# Patient Record
Sex: Female | Born: 1960 | Race: Black or African American | Hispanic: No | Smoking: Never smoker
Health system: Southern US, Community
[De-identification: ages and names within clinical notes are randomized; demographics above are authoritative.]

## PROBLEM LIST (undated history)

## (undated) ENCOUNTER — Encounter

## (undated) ENCOUNTER — Other Ambulatory Visit

## (undated) ENCOUNTER — Telehealth

## (undated) DIAGNOSIS — N760 Acute vaginitis: Secondary | ICD-10-CM

## (undated) DIAGNOSIS — B9689 Other specified bacterial agents as the cause of diseases classified elsewhere: Secondary | ICD-10-CM

## (undated) DIAGNOSIS — I10 Essential (primary) hypertension: Secondary | ICD-10-CM

## (undated) DIAGNOSIS — E785 Hyperlipidemia, unspecified: Secondary | ICD-10-CM

## (undated) DIAGNOSIS — J45909 Unspecified asthma, uncomplicated: Secondary | ICD-10-CM

## (undated) DIAGNOSIS — A539 Syphilis, unspecified: Secondary | ICD-10-CM

## (undated) DIAGNOSIS — B20 Human immunodeficiency virus [HIV] disease: Secondary | ICD-10-CM

## (undated) DIAGNOSIS — Z21 Asymptomatic human immunodeficiency virus [HIV] infection status: Secondary | ICD-10-CM

## (undated) HISTORY — DX: Essential (primary) hypertension: I10

## (undated) HISTORY — PX: MANDIBLE SURGERY: SHX707

## (undated) HISTORY — PX: ABDOMINAL HYSTERECTOMY: SHX81

## (undated) HISTORY — PX: CHOLECYSTECTOMY: SHX55

## (undated) HISTORY — PX: REVISION TOTAL KNEE ARTHROPLASTY: SHX767

## (undated) HISTORY — DX: Hyperlipidemia, unspecified: E78.5

---

## 2016-05-07 ENCOUNTER — Encounter: Primary: Family Medicine

## 2016-05-20 DIAGNOSIS — Z1231 Encounter for screening mammogram for malignant neoplasm of breast: Principal | ICD-10-CM

## 2016-05-23 ENCOUNTER — Inpatient Hospital Stay: Admit: 2016-05-23 | Discharge: 2016-05-24 | Primary: Family Medicine

## 2016-05-23 DIAGNOSIS — J45909 Unspecified asthma, uncomplicated: Secondary | ICD-10-CM

## 2016-05-23 DIAGNOSIS — F319 Bipolar disorder, unspecified: Secondary | ICD-10-CM

## 2016-05-23 DIAGNOSIS — Z79890 Hormone replacement therapy (postmenopausal): Secondary | ICD-10-CM

## 2016-05-23 DIAGNOSIS — F329 Major depressive disorder, single episode, unspecified: Secondary | ICD-10-CM

## 2016-05-23 DIAGNOSIS — R921 Mammographic calcification found on diagnostic imaging of breast: Secondary | ICD-10-CM

## 2016-05-23 DIAGNOSIS — B999 Unspecified infectious disease: Secondary | ICD-10-CM

## 2016-05-23 DIAGNOSIS — B20 Human immunodeficiency virus [HIV] disease: Principal | ICD-10-CM

## 2016-05-23 DIAGNOSIS — M5126 Other intervertebral disc displacement, lumbar region: Secondary | ICD-10-CM

## 2016-05-23 DIAGNOSIS — I10 Essential (primary) hypertension: Secondary | ICD-10-CM

## 2016-05-23 DIAGNOSIS — M1990 Unspecified osteoarthritis, unspecified site: Secondary | ICD-10-CM

## 2016-05-23 DIAGNOSIS — D849 Immunodeficiency, unspecified: Secondary | ICD-10-CM

## 2016-05-23 DIAGNOSIS — Z1231 Encounter for screening mammogram for malignant neoplasm of breast: Principal | ICD-10-CM

## 2016-05-23 DIAGNOSIS — N8111 Cystocele, midline: Secondary | ICD-10-CM

## 2016-06-09 ENCOUNTER — Ambulatory Visit: Admit: 2016-06-09 | Discharge: 2016-06-09 | Attending: Family | Primary: Family Medicine

## 2016-06-09 DIAGNOSIS — Z79890 Hormone replacement therapy (postmenopausal): Secondary | ICD-10-CM

## 2016-06-09 DIAGNOSIS — Z780 Asymptomatic menopausal state: Secondary | ICD-10-CM

## 2016-06-09 DIAGNOSIS — D849 Immunodeficiency, unspecified: Secondary | ICD-10-CM

## 2016-06-09 DIAGNOSIS — F329 Major depressive disorder, single episode, unspecified: Secondary | ICD-10-CM

## 2016-06-09 DIAGNOSIS — Z21 Asymptomatic human immunodeficiency virus [HIV] infection status: Secondary | ICD-10-CM

## 2016-06-09 DIAGNOSIS — M542 Cervicalgia: Secondary | ICD-10-CM

## 2016-06-09 DIAGNOSIS — B20 Human immunodeficiency virus [HIV] disease: Principal | ICD-10-CM

## 2016-06-09 DIAGNOSIS — M5126 Other intervertebral disc displacement, lumbar region: Secondary | ICD-10-CM

## 2016-06-09 DIAGNOSIS — J45909 Unspecified asthma, uncomplicated: Secondary | ICD-10-CM

## 2016-06-09 DIAGNOSIS — N3946 Mixed incontinence: Secondary | ICD-10-CM

## 2016-06-09 DIAGNOSIS — Z9889 Other specified postprocedural states: Secondary | ICD-10-CM

## 2016-06-09 DIAGNOSIS — N952 Postmenopausal atrophic vaginitis: Secondary | ICD-10-CM

## 2016-06-09 DIAGNOSIS — B9689 Other specified bacterial agents as the cause of diseases classified elsewhere: Secondary | ICD-10-CM

## 2016-06-09 DIAGNOSIS — N8111 Cystocele, midline: Secondary | ICD-10-CM

## 2016-06-09 DIAGNOSIS — N760 Acute vaginitis: Secondary | ICD-10-CM

## 2016-06-09 DIAGNOSIS — F319 Bipolar disorder, unspecified: Secondary | ICD-10-CM

## 2016-06-09 DIAGNOSIS — I10 Essential (primary) hypertension: Secondary | ICD-10-CM

## 2016-06-09 DIAGNOSIS — B999 Unspecified infectious disease: Secondary | ICD-10-CM

## 2016-06-09 DIAGNOSIS — Z79899 Other long term (current) drug therapy: Secondary | ICD-10-CM

## 2016-06-09 DIAGNOSIS — N3289 Other specified disorders of bladder: Principal | ICD-10-CM

## 2016-06-09 DIAGNOSIS — N941 Unspecified dyspareunia: Secondary | ICD-10-CM

## 2016-06-09 DIAGNOSIS — M1990 Unspecified osteoarthritis, unspecified site: Secondary | ICD-10-CM

## 2016-06-09 DIAGNOSIS — R310 Gross hematuria: Secondary | ICD-10-CM

## 2016-06-09 MED ORDER — ESTROGENS, CONJUGATED 0.625 MG/GM VA CREA
4 refills | Status: CP
Start: 2016-06-09 — End: 2016-06-10

## 2016-06-09 MED ORDER — MELOXICAM 7.5 MG PO TABS
7.5 mg | Freq: Every day | ORAL
Start: 2016-06-09 — End: ?

## 2016-06-09 MED ORDER — GENVOYA PO
ORAL
Start: 2016-06-09 — End: ?

## 2016-06-09 MED ORDER — ESTROGENS, CONJUGATED 0.625 MG/GM VA CREA
4 refills | Status: CP
Start: 2016-06-09 — End: ?

## 2016-06-10 NOTE — Progress Notes
Female Pelvic Medicine & Reconstructive Surgery (Urogynecology) Clinic    Pertinent Urogyn Hx:  4 Bladder Surgeries (prolapse repair, mesh insertion, mesh removal, and Sling ([place] and 1 here at [facility] 01/2012 AR &TOT  [facility] [place] [facility] (2 different Physicians)  HIV Positive since 1990  S/P Hyst for AUB/Fibroids  MUI (S>U)    Primary care Doctor: Prentiss BellsJaminal, Mar A    REASON FOR VISIT: Bladder Prolapse per referral  Chief Complaint   Patient presents with   ? Uro/Gyn Initial Visit     HISTORY OF PRESENT ILLNESS:   The patient is a 55 y.o. G3P2 postmenopausal female, who is being seen today for bladder prolapse. She reports that she leaks on occasion with stress maneuvers. She endorses leaking on the way to the bathroom. She states that she has signed a form for a class action law suit for problems after mesh placement. She also reports a chronic problem with BV and vagianal discharge. She states that she has pain and bleeding with intercourse and saw blood in the last 2 months after voiding when she wiped. She states that she is most bothered by the SUI.    UROGYN Hx:  Pelvic Organ Prolapse:  ? Patient reports bulge symptoms including: she feels something at the opening when she wipes     Urinary Incontinence:  ? Daytime voids: 6 (she feels like this is too often)  ? Nighttime Voids:  3 to 4    ? Patient routinely experiences strong urinary urge that is difficult to ignore.  ? Patient has urge of urination Yes  ? Bladder irritants consumed: Yes; List: coffee on occas, occas soda, sugars and candy  ? Patient has taken Oxybutynin in the past with some help - prior to her surgeries  ? Urge Urinary Incontince: sudden and immediate urge to void.  ? The patient has leaked urine with urgency for 1 year   ? Number of UUI episodes in daytime: 4 - 6  Number of UUI episodes in nighttime: 3 - 4     ? Patient with stress urinary Incontinence: Yes, If yes SUI occurs with:

## 2016-06-10 NOTE — Progress Notes
3. Mixed stress and urge urinary incontinence N39.46 788.33    4. Dyspareunia, female N94.10 625.0    5. History of genitourinary surgery Z98.890 V45.89 CT UROGRAM w/o & w/ IV con, NO PO con      CYSTOURETHROSCOPY      BASIC METABOLIC PANEL   6. Hematuria, gross R31.0 599.71 CT UROGRAM w/o & w/ IV con, NO PO con      CYSTOURETHROSCOPY      BASIC METABOLIC PANEL     PLAN:  - UA with micro and Urine culture done at this visit.  Will follow-up on these results and treat if indicated.  - All options discussed with patient including conservative management with pelvic floor physical therapy, pessary use as well as surgical options.   - Pt does not consume much in the way of bladder irritants (sugar mainly) - will teach pt Urge Supression Technique and KNACK Maneuver at [facility]   - Will address constipation at [facility]  - Orders placed for Cysto with Dr. Peri JeffersonGood and CT Urogram due to gross hematuria and multiple bladder surgeries including TOT as well as findings on exam  - This pt was offered to enroll in "MyChart" during this encounter - info provided  - All communication for appts, etc should go through her Case Worker, Gabriel Rainwaterenesha at [phone]  - pt left without signing a release for her records, will get that at her [facility]  - Rx sent to pharmacy for E2 cream  - Rs sent to pharmacy for Metrogel at hs X 5 nights  - Urogynecology follow-up: 4 weeks after CT and Cysto and will address possible need for OAB meds    Adelina MingsMonica Major-Harris, ARNP    The patient was given specific written information about her condition(s) and a print-out of her treatment plan.     Topics of discussion with patient included counseling and/or coordination of care including reviewing records and discussing DDx and treatment options.    Thank you for referring Beth Graham to the [facility] and allowing for me to participate in her care. Please feel free to contact our office with any questions or concerns.

## 2016-06-10 NOTE — Progress Notes
?   Lumbar herniated disc    ? Unspecified essential hypertension 05/06/2011   ? Unspecified infectious and parasitic diseases        Medications:  Current Outpatient Medications    Medication Sig Start Date End Date Taking? Authorizing Provider   amLODIPine (NORVASC) 5 MG tablet Take 1 Tablet by mouth daily. 02/08/14  Yes Clancy GourdSmith, Doris Elizabeth, ARNP   Elviteg-Cobic-Emtricit-TenofAF (GENVOYA PO) by mouth.   Yes Information, Historical   meloxicam (MOBIC) 7.5 MG Tablet Take 7.5 mg by mouth daily.   Yes Information, Historical   VENTOLIN HFA 108 (90 BASE) MCG/ACT inhaler Inhale 2 puffs every 6 hours as needed for wheezing. 02/08/14  Yes Clancy GourdSmith, Doris Elizabeth, ARNP   abacavir-lamivudine-zidovudine (TRIZIVIR) 300-150-300 MG per tablet Take 1 Tablet by mouth 2 times daily.      Information, Historical   Atazanavir Sulfate (REYATAZ PO) Take  by mouth daily.    Information, Historical   citalopram (CeleXA) 20 MG tablet Take 1 Tablet by mouth daily. 02/08/14   Clancy GourdSmith, Doris Elizabeth, ARNP   cyclobenzaprine (FLEXERIL) 5 MG tablet Take 1 Tablet by mouth 2 times daily as needed for muscle spasms. 02/08/14   Clancy GourdSmith, Doris Elizabeth, ARNP   divalproex (DEPAKOTE) 500 MG DR tablet Take 1 Tablet by mouth daily. 02/08/14   Clancy GourdSmith, Doris Elizabeth, ARNP   estrogens, conjugated, (PREMARIN) 0.3 MG tablet Take 1 Tablet by mouth daily. 02/08/14   Clancy GourdSmith, Doris Elizabeth, ARNP   ibuprofen (ADVIL,MOTRIN) 800 MG tablet Take 1 Tablet by mouth every 8 hours as needed for pain. 02/08/14   Clancy GourdSmith, Doris Elizabeth, ARNP     Allergies:  Allergies   Allergen Reactions   ? Sulfa Drugs Swelling   ? Sulfate Swelling     Past Surgical History:   Past Surgical History:   Procedure Laterality Date   ? ABDOMEN SURGERY     ? BLADDER SURGERY      x 3   ? BLADDER SURGERY      Surgery Description: Bladder Surgery;   (Created by Conversion)   ? CHOLECYSTECTOMY     ? HYSTERECTOMY  March 2002   ? HYSTERECTOMY      Surgery Description: Hysterectomy;   (Created by Conversion)

## 2016-06-10 NOTE — Progress Notes
PVR= 29mL

## 2016-06-10 NOTE — Progress Notes
coughing, sneezing, laughing, jumping and position changes.  ? The patient has leaked urine with stress manuevers for 1 year  ? Number of SUI episodes per day: 5     ? Unconscious leakage of urine:  Yes it has happened in the past   ? Nocturnal enuresis (bedwetting): Yes, rarely     ? Number of pads used: 4 pads per day  ? Type of Pads: Poise Pads  ? Sensation of incomplete emptying with voiding:  No  ? Uses fingers/hand to empty bladder: No  ? Dysuria:  No  ? Has patient visualized gross blood in urine: Once acouple of months ago, she wiped and saw blood on the toilet paper   ? Recurrent urinary tract infections: No  ? How many Urinary Tract Infections in previous 12 months: 0  ? Urine Stream as:  ? Normal x Strong ? Weak    Bowel Hx:     ? Number of bowel movements: 3 - 4 per week  ? Patient stool type, [place] type:  3  ? Constipation:  Yes  ? Splinting; uses finger/hand to evacuate stool: No but she takes stool softeners  ? Anal incontinence with stool: No   ? Anal incontinence with gas: No    Other Hx:  ? Pelvic pain: Yes on occas  ? Menopausal symptoms:   ? Hot flashes: No  ? Vaginal Dryness: Yes  ? Difficulty Sleeping: Yes  ? Mood swings: No    Sexual History:  ? Sexual Activity: no, not since May of 2017  ? Dyspareunia: yes and she bleeds.    Gynecological and Obstetrical History:   ? Last menstrual period: 10/2000  ? G 3 P2  ? Route of delivery: vaginal     Forceps: No       Episitotomy? Yes  ? Largest baby: 6 lbs 15 oz  ? Gyn: PM  ? Contraception Use: BTL  ? Hormone Therapy Use: stopped Premarin  ? Menopausal status: PM  ? Postmenopausal bleeding: na    Past Medical History:   Past Medical History:   Diagnosis Date   ? Asthma    ? Bipolar 1 disorder    ? Cystocele, midline 01/07/2012   ? Depression    ? DJD (degenerative joint disease)    ? HIV (human immunodeficiency virus infection)    ? Hormone replacement therapy (postmenopausal)    ? HX OTHER MEDICAL    ? Immune deficiency disorder

## 2016-06-10 NOTE — Progress Notes
affect appear normal, does not appear depressed or anxious.  Skin: no rashes or significant lesions.  GI: soft, nontender, nondistended, no masses or organomegaly, Hernia present? No, Prior Abdominal scars? No  Extremities: no edema, redness or tenderness in the calves or thighs   Extremities:  Sensation Symmetric:Yes  Sensation Equal:Yes  Motor: normal     Genitourinary Exam:   External genitalia: atrophic   Urethra: no abnormality  Urethral hypermobility: no, fixed  Vagina: atrophic, specimen obtained for wet mount, + Clue Cells, + Whiff Test, negative Hyphae  Bladder: tender to palpation at the bladder neck - this is where the pt describes pain with intercourse  Uterus: surgically absent   Cervix: surgically absent   Adnexae:within normal limits   Inguinal Nodes:within normal limits   Supine Stress Test:  negative empty supine stress test with cough/valsalva    POPQ:  Aa -3 Ba -3 C -7   gh 3.5 pb 4 tvl 8   Ap -3 Bp -3 D na     Anterior Wall Stage: 0  Apical/transverse Compartment Stage: 0  Posterior Compartment Stage: 0  Enterocele:no    Focused GU Neuro:   Sensory S2, S3, S4 Right: normal; Left: normal   Blbocavernous reflex Right: Yes;  Left: Yes.  Anal wink Right:  yes;  Left:  yes.  Levator ani:   Tone right: 3/5;  Left: 3/5  Contraction right: 3/5;  Left: 3/5  Taut muscle bands:  no.  Rectal Exam:   Prolapse: no  Hemorroids: no  Anal sphincter tone:  na  Anal sphincter contraction:  na  Anal sphincter intact: yes.    ASSESSMENT:  55 y.o. G3P2 with MUI (S>U), BV, Atrophy, Tender Bladder Neck, Dypareunia, Gross Hematuria    - PVR done today indicates patient without signs of urinary retention.  - UA dip indicates patient without sign of infection today.      ICD-10-CM ICD-9-CM    1. Vaginal atrophy N95.2 627.3 conjugated estrogens (PREMARIN) 0.625 MG/GM Cream      DISCONTINUED: conjugated estrogens (PREMARIN) 0.625 MG/GM Cream   2. BV (bacterial vaginosis) N76.0 616.10     B96.89 041.9

## 2016-06-10 NOTE — Progress Notes
?   LAPAROSCOPY      Surgery Description: Laparoscopic Sling Operation For Stress Incontinence;   (Created by Conversion)   ? MANDIBLE FRACTURE SURGERY     ? MANDIBLE SURGERY      Surgery Description: Jaw Surgery;   (Created by Conversion)   ? OTHER SURGICAL HISTORY      Surgery Description: Anastomosis Of Gallbladder;   (Created by Conversion)   ? TUBAL LIGATION     ? TUBAL LIGATION      Surgery Description: Tubal Ligation;   (Created by Conversion)   ? URETHROPLASTY      Surgery Description: Urethroplasty;   (Created by Conversion)     Social Hx:   ? 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 Marital Status: single  ? Tobacco smoking: No, Excessive alcohol use: No, Drugs: No, Domestic Violence: No    Family Hx:   No family history of hereditary cancers    Most Recent Health Screening:  ? Last Pap: has appt on 06/20/16     Result: no hx of abnormals  ? Last Mammogram: Oct 2017 - normal    ? Colonoscopy: due     The patient's complete medical history was reviewed, including medical diagnoses, surgery, medications, allergies, social and family history.    Medical records including clinical and/or surgical notes, laboratory and/or imaging results from an outside institution were thoroughly reviewed with the patient. Yes    Review of Systems: All systems below were reviewed with patient and indicated below if positive.    [facility] UROGYN ROS   Pt to complete at [facility]    OBJECTIVE: BP 106/66 - Pulse 89 - Temp 36.6 ?C (97.9 ?F) - Resp 16 - Ht 1.702 m (5\' 7" ) - Wt 95.3 kg (210 lb) - BMI 32.89 kg/m2    Scanner pvr:  29 cc  Urine dip: moderate blood, large leuks, negative nitrites          General:  well developed, well nourished, in no apparent distress  Psych: oriented to time, place and person and bright and alert mood and

## 2016-07-10 ENCOUNTER — Encounter: Primary: Family Medicine

## 2016-10-06 ENCOUNTER — Encounter: Primary: Family Medicine

## 2016-11-26 ENCOUNTER — Inpatient Hospital Stay: Admit: 2016-11-26 | Discharge: 2016-11-26

## 2016-11-26 DIAGNOSIS — J45909 Unspecified asthma, uncomplicated: Secondary | ICD-10-CM

## 2016-11-26 DIAGNOSIS — R040 Epistaxis: Principal | ICD-10-CM

## 2016-11-26 DIAGNOSIS — F319 Bipolar disorder, unspecified: Secondary | ICD-10-CM

## 2016-11-26 DIAGNOSIS — M1990 Unspecified osteoarthritis, unspecified site: Secondary | ICD-10-CM

## 2016-11-26 DIAGNOSIS — I10 Essential (primary) hypertension: Secondary | ICD-10-CM

## 2016-11-26 DIAGNOSIS — F419 Anxiety disorder, unspecified: Secondary | ICD-10-CM

## 2016-11-26 DIAGNOSIS — R05 Cough: Secondary | ICD-10-CM

## 2016-11-26 DIAGNOSIS — B20 Human immunodeficiency virus [HIV] disease: Principal | ICD-10-CM

## 2016-11-26 DIAGNOSIS — Z79899 Other long term (current) drug therapy: Secondary | ICD-10-CM

## 2016-11-26 DIAGNOSIS — N8111 Cystocele, midline: Secondary | ICD-10-CM

## 2016-11-26 DIAGNOSIS — Z791 Long term (current) use of non-steroidal anti-inflammatories (NSAID): Secondary | ICD-10-CM

## 2016-11-26 DIAGNOSIS — D849 Immunodeficiency, unspecified: Secondary | ICD-10-CM

## 2016-11-26 DIAGNOSIS — B999 Unspecified infectious disease: Secondary | ICD-10-CM

## 2016-11-26 DIAGNOSIS — F329 Major depressive disorder, single episode, unspecified: Secondary | ICD-10-CM

## 2016-11-26 DIAGNOSIS — F1490 Cocaine use, unspecified, uncomplicated: Secondary | ICD-10-CM

## 2016-11-26 DIAGNOSIS — Z79890 Hormone replacement therapy (postmenopausal): Secondary | ICD-10-CM

## 2016-11-26 DIAGNOSIS — M5126 Other intervertebral disc displacement, lumbar region: Secondary | ICD-10-CM

## 2016-11-26 MED ORDER — SALINE NASAL SPRAY 0.65 % NA SOLN
1 | NASAL | 0 refills | Status: CP | PRN
Start: 2016-11-26 — End: ?

## 2016-11-26 NOTE — ED Notes
Ice pack and education given to pt on use by this RN. Marina Goodell RN is aware of pt.

## 2016-11-26 NOTE — ED Provider Notes
ED Clinical Impression   ED Clinical Impression:   Epistaxis      ED Patient Status   Patient Status:   Good        ED Medical Evaluation Initiated   Medical Evaluation Initiated:   Yes, filed at 11/26/16 1857  by Sander Nephew, MD             Sander Nephew, MD  Resident  11/26/16 [phone]

## 2016-11-26 NOTE — ED Provider Notes
Coarse BS. No wheezing, rales or rhonchi.    Abdominal: Soft. She exhibits no distension. There is no tenderness. There is no rebound and no guarding.   Musculoskeletal: Normal range of motion. She exhibits no edema, tenderness or deformity.   Neurological: She is alert and oriented to person, place, and time. No cranial nerve deficit.   Skin: Skin is warm. No rash noted.   Nursing note and vitals reviewed.      Differential DDx: epistaxsis vs PNA vs asthma vs allergies vs other    Is this an Emergent Medical Condition? Yes - Severe Pain/Acute Onset of Symptons  409.901 FS  641.19 FS  627.732 (16) FS    ED Workup   Procedures    Labs:  - - No data to display      Imaging (Read by ED Provider):  not applicable      EKG (Read by ED Provider):  not applicable        ED Course & Re-Evaluation     ED Course     Pt nosebleed stopped with pressure in waiting room. Pt give nasal saline. Pt CD4 was self reported at 700 in last visit in 2016. Pt sts she does not miss any of her hiv meds and f/u with her hiv doctor regularly (and they act as her pcp prescribing her albuterol and other medications). Pt told to f/u with her pcp in 2 days and given proper instruction on how to hold pressure when she has a nose bleed. Discussed workup and  plan w/ pt. Pt able to hold conversation regarding the risks, benefits, and alternatives to this work up and plan. Pt able to articulate plan in their own words and agrees with this plan. Pt given strict return precautions and instructed to f/u with PCP w/in  2 days. Pt understands and agrees.    Sander Nephew, MD 7:13 PM 11/26/2016        MDM   Decide to obtain history from someone other than the patient: No    Decide to obtain previous medical records:No    Clinical Lab Test(s): N/A    Diagnostic Tests (Radiology, EKG): N/A    Independent Visualization (ED [place], Wet Prep, Other): No    Discussed patient with NON-ED Provider: None      ED Disposition   ED Disposition: Discharge

## 2016-11-26 NOTE — ED Notes
Time of discharge: 1940  PM., Patient discharged to  Home.  Patient discharged  via wheelchair. to exit with belongings in  Stable condition.  Patient escorted by  family., Written discharge instructions given to  patient.  Patient/recipient  verbalizes discharge instructions.

## 2016-11-26 NOTE — ED Triage Notes
56 yo AAF to [facility] via private car with c/o nose bleed. Pt reports nose bleeding yesterday which she was seen and treated at [facility] and released. Pt reports started back about 30 minutes. Pt denies CP, SOB. Pt currently spitting out blood in trash can. Pt to [facility] for eval.

## 2016-11-26 NOTE — ED Provider Notes
History     Chief Complaint   Patient presents with   ? Epistaxis   ? Anxiety       HPI Comments: 56 yo F hx bipolar, HIV presents to ED c/o of epistaxsis which began 30 minutes PTA. Pt went to [facility] for nosebleed that resolved yesterday. Pt nose bleed has already resolved prior to being seen by ED physician. Pt also has a cough that began 4 days again which she is taking over the counter cough medicine. Pt has no fever, CP, SOB, NVD, abd pain, focal weakness. HA or other complaints    Patient is a 56 y.o. female presenting with nosebleeds. The history is provided by the patient.   Epistaxis   Location:  Unable to specify  Duration:  45 minutes  Progression:  Resolved  Chronicity:  Recurrent  Context: not anticoagulants    Relieved by:  Nothing  Worsened by:  Nothing  Ineffective treatments:  None tried  Associated symptoms: cough    Associated symptoms: no congestion, no dizziness, no facial pain, no fever, no headaches, no sinus pain, no sneezing, no sore throat and no syncope        Allergies   Allergen Reactions   ? Sulfa Drugs Swelling   ? Sulfate Swelling       Patient's Medications   New Prescriptions    SODIUM CHLORIDE (OCEAN) 0.65 % NA SOLUTION    1 spray by Nasal route as needed for congestion.   Previous Medications    ABACAVIR-LAMIVUDINE-ZIDOVUDINE (TRIZIVIR) 300-150-300 MG PER TABLET    Take 1 Tablet by mouth 2 times daily.      AMLODIPINE (NORVASC) 5 MG TABLET    Take 1 Tablet by mouth daily.    ATAZANAVIR SULFATE (REYATAZ PO)    Take  by mouth daily.    CITALOPRAM (CELEXA) 20 MG TABLET    Take 1 Tablet by mouth daily.    CONJUGATED ESTROGENS (PREMARIN) 0.625 MG/GM CREAM    Apply a dime sized amount (1/4 g) to the urethra and the vaginal opening nightly X 2 weeks and then reduce to twice weekly    CYCLOBENZAPRINE (FLEXERIL) 5 MG TABLET    Take 1 Tablet by mouth 2 times daily as needed for muscle spasms.    DIVALPROEX (DEPAKOTE) 500 MG DR TABLET    Take 1 Tablet by mouth daily.

## 2016-11-26 NOTE — ED Provider Notes
ELVITEG-COBIC-EMTRICIT-TENOFAF (GENVOYA PO)    by mouth.    ESTROGENS, CONJUGATED, (PREMARIN) 0.3 MG TABLET    Take 1 Tablet by mouth daily.    IBUPROFEN (ADVIL,MOTRIN) 800 MG TABLET    Take 1 Tablet by mouth every 8 hours as needed for pain.    MELOXICAM (MOBIC) 7.5 MG TABLET    Take 7.5 mg by mouth daily.    VENTOLIN HFA 108 (90 BASE) MCG/ACT INHALER    Inhale 2 puffs every 6 hours as needed for wheezing.   Modified Medications    No medications on file   Discontinued Medications    No medications on file       Past Medical History:   Diagnosis Date   ? Asthma    ? Bipolar 1 disorder    ? Cystocele, midline 01/07/2012   ? Depression    ? DJD (degenerative joint disease)    ? HIV (human immunodeficiency virus infection)    ? Hormone replacement therapy (postmenopausal)    ? HX OTHER MEDICAL    ? Immune deficiency disorder    ? Lumbar herniated disc    ? Unspecified essential hypertension 05/06/2011   ? Unspecified infectious and parasitic diseases        Past Surgical History:   Procedure Laterality Date   ? ABDOMEN SURGERY     ? BLADDER SURGERY      x 3   ? BLADDER SURGERY      Surgery Description: Bladder Surgery;   (Created by Conversion)   ? CHOLECYSTECTOMY     ? HYSTERECTOMY  March 2002   ? HYSTERECTOMY      Surgery Description: Hysterectomy;   (Created by Conversion)   ? LAPAROSCOPY      Surgery Description: Laparoscopic Sling Operation For Stress Incontinence;   (Created by Conversion)   ? MANDIBLE FRACTURE SURGERY     ? MANDIBLE SURGERY      Surgery Description: Jaw Surgery;   (Created by Conversion)   ? OTHER SURGICAL HISTORY      Surgery Description: Anastomosis Of Gallbladder;   (Created by Conversion)   ? TUBAL LIGATION     ? TUBAL LIGATION      Surgery Description: Tubal Ligation;   (Created by Conversion)   ? URETHROPLASTY      Surgery Description: Urethroplasty;   (Created by Conversion)       Family History   Problem Relation Age of Onset   ? Asthma Mother    ? High Blood Pressure Mother

## 2016-11-26 NOTE — ED Provider Notes
?   High Blood Pressure Father    ? Diabetes Maternal Grandfather    ? Breast Cancer Neg Hx        Social History     Social History   ? Marital status: Widowed     Spouse name: N/A   ? Number of children: N/A   ? Years of education: N/A     Social History Main Topics   ? Smoking status: Never Smoker   ? Smokeless tobacco: Never Used   ? Alcohol use No   ? Drug use: No      Comment: states last used cocaine 01/2011   ? Sexual activity: Not Currently     Other Topics Concern   ? None     Social History Narrative       Review of Systems   Constitutional: Negative for fever.   HENT: Positive for nosebleeds. Negative for nasal congestion, sore throat, nasal discharge and sneezing.    Eyes: Negative for visual disturbance.   Respiratory: Positive for cough. Negative for choking.    Cardiovascular: Negative for chest pain and syncope.   Gastrointestinal: Negative for nausea and abdominal pain.   Genitourinary: Negative for dysuria, vaginal discharge and difficulty urinating.   Skin: Negative for rash.   Neurological: Negative for dizziness and headaches.       Physical Exam       ED Triage Vitals   BP 11/26/16 1649 141/74   Pulse 11/26/16 1649 97   Resp 11/26/16 1649 20   Temp 11/26/16 1649 36.8 ?C (98.2 ?F)   Temp src 11/26/16 1649 Oral   Height 11/26/16 1649 1.702 m   Weight 11/26/16 1649 81.6 kg   SpO2 11/26/16 1649 98 %   BMI (Calculated) 11/26/16 1649 28.25             Physical Exam   Constitutional: She is oriented to person, place, and time. She appears well-developed and well-nourished.   HENT:   Head: Normocephalic and atraumatic.   No hematoma or active bleeding from nares   Eyes: Conjunctivae and EOM are normal. Pupils are equal, round, and reactive to light.   Neck: Normal range of motion.   Cardiovascular: Normal rate, regular rhythm and normal heart sounds.    No murmur heard.  Pulmonary/Chest: Effort normal and breath sounds normal. No respiratory distress. She has no wheezes. She has no rales.

## 2017-04-07 ENCOUNTER — Encounter: Primary: Family Medicine

## 2020-06-13 DIAGNOSIS — I10 Essential (primary) hypertension: Secondary | ICD-10-CM | POA: Insufficient documentation

## 2020-06-13 DIAGNOSIS — Z21 Asymptomatic human immunodeficiency virus [HIV] infection status: Secondary | ICD-10-CM | POA: Insufficient documentation

## 2020-09-11 DIAGNOSIS — G8929 Other chronic pain: Secondary | ICD-10-CM | POA: Insufficient documentation

## 2020-10-28 DIAGNOSIS — J4541 Moderate persistent asthma with (acute) exacerbation: Secondary | ICD-10-CM | POA: Insufficient documentation

## 2021-07-11 ENCOUNTER — Emergency Department
Admission: EM | Admit: 2021-07-11 | Discharge: 2021-07-11 | Disposition: A | Discharge status: Home / Self Care | Attending: Emergency Medicine | Admitting: Emergency Medicine

## 2021-07-11 ENCOUNTER — Other Ambulatory Visit: Payer: Self-pay

## 2021-07-11 DIAGNOSIS — Z21 Asymptomatic human immunodeficiency virus [HIV] infection status: Secondary | ICD-10-CM | POA: Diagnosis not present

## 2021-07-11 DIAGNOSIS — R21 Rash and other nonspecific skin eruption: Secondary | ICD-10-CM | POA: Diagnosis present

## 2021-07-11 DIAGNOSIS — A519 Early syphilis, unspecified: Secondary | ICD-10-CM | POA: Insufficient documentation

## 2021-07-11 DIAGNOSIS — A5139 Other secondary syphilis of skin: Secondary | ICD-10-CM

## 2021-07-11 MED ORDER — PENICILLIN G BENZATHINE 1200000 UNIT/2ML IM SUSY
2.4000 10*6.[IU] | PREFILLED_SYRINGE | Freq: Once | INTRAMUSCULAR | Status: AC
  Administered 2021-07-11: 2.4 10*6.[IU] via INTRAMUSCULAR
  Filled 2021-07-11: qty 4

## 2021-07-11 NOTE — ED Triage Notes (Signed)
Pt reports that she is hiv+ and was just seen by her dr a week ago and one of her tests came back pos for syphillis. Pt states that she doesn't want to fly back to [place] to be treated and wants to be treated here

## 2021-07-11 NOTE — ED Provider Notes (Signed)
[facility] Emergency Department Provider Note  ____________________________________________  Time seen: Approximately 2:10 PM  I have reviewed the triage vital signs and the nursing notes.   HISTORY  Chief Complaint Rash    HPI Hailey Reyes is a 60 y.o. female with a past history of HIV who comes the ED reporting a new syphilis infection.  She has a history of syphilis from about 25 years ago which was treated at that time.  She reports she only has 1 sexual partner who is a man married to another woman.  Denies any vaginal discharge fevers chills flulike illness or complaints other than a small rash on her right upper chest.  A few days ago, the patient was in [place] following up with her HIV doctor for her semiannual follow-up.  HIV labs were good and patient reports good compliance with medication.  However, her doctor obtained syphilis labs which show positive antibody test, RPR titer of 1:128.  An antibody test was negative in November 2021.  These labs were reviewed with the patient via her MyChart account that she accessed with her smart phone.  Denies any seizures or episodes of loss of consciousness or confusion.   Past medical history includes HIV   There are no problems to display for this patient.       Prior to Admission medications   Not on File     Allergies Elemental sulfur   No family history on file.  Social History    Review of Systems  Constitutional:   No fever or chills.  ENT:   No sore throat. No rhinorrhea. Cardiovascular:   No chest pain or syncope. Respiratory:   No dyspnea or cough. Gastrointestinal:   Negative for abdominal pain, vomiting and diarrhea.  Musculoskeletal:   Negative for focal pain or swelling All other systems reviewed and are negative except as documented above in ROS and HPI.  ____________________________________________   PHYSICAL EXAM:  VITAL SIGNS: ED Triage Vitals  Enc  Vitals Group     BP 07/11/21 1323 133/69     Pulse Rate 07/11/21 1323 100     Resp 07/11/21 1323 16     Temp 07/11/21 1323 98.2 F (36.8 C)     Temp Source 07/11/21 1323 Oral     SpO2 07/11/21 1323 100 %     Weight 07/11/21 1324 216 lb (98 kg)     Height 07/11/21 1324 5\' 7"  (1.702 m)     Head Circumference --      Peak Flow --      Pain Score 07/11/21 1323 0     Pain Loc --      Pain Edu? --      Excl. in GC? --     Vital signs reviewed, nursing assessments reviewed.   Constitutional:   Alert and oriented. Non-toxic appearance. Eyes:   Conjunctivae are normal. EOMI. ENT      Head:   Normocephalic and atraumatic.      Mouth/Throat:   MMM      Neck:   No meningismus. Full ROM. Hematological/Lymphatic/Immunilogical:   No cervical lymphadenopathy. Cardiovascular:   RRR. Symmetric bilateral radial and DP pulses.  No murmurs. Cap refill less than 2 seconds. Respiratory:   Normal respiratory effort without tachypnea/retractions.  Musculoskeletal:   Normal range of motion in all extremities.  No edema. Neurologic:   Normal speech and language.  Motor grossly intact. No acute focal neurologic deficits are appreciated.  Skin:  Skin is warm, dry and intact.  There is a small rash on the right upper chest consisting of about a 4 cm cluster of purpuric papules.  No other rash, no findings on the palms..  ____________________________________________    LABS (pertinent positives/negatives) (all labs ordered are listed, but only abnormal results are displayed) Labs Reviewed - No data to display ____________________________________________   EKG  ____________________________________________    RADIOLOGY  No results found.  ____________________________________________   PROCEDURES Procedures  ____________________________________________  CLINICAL IMPRESSION / ASSESSMENT AND PLAN / ED COURSE  Pertinent labs & imaging results that were available during my care of the  patient were reviewed by me and considered in my medical decision making (see chart for details).  Hailey Reyes was evaluated in Emergency Department on 07/11/2021 for the symptoms described in the history of present illness. She was evaluated in the context of the global COVID-19 pandemic, which necessitated consideration that the patient might be at risk for infection with the SARS-CoV-2 virus that causes COVID-19. Institutional protocols and algorithms that pertain to the evaluation of patients at risk for COVID-19 are in a state of rapid change based on information released by regulatory bodies including the CDC and federal and state organizations. These policies and algorithms were followed during the patient's care in the ED.   Patient presents with diagnosis of syphilis.  This is early syphilis, and she is nontoxic and otherwise in her normal state of health except for a small area of rash.  We will give a dose of IM penicillin G, discharged to follow-up with ID.      ____________________________________________   FINAL CLINICAL IMPRESSION(S) / ED DIAGNOSES    Final diagnoses:  Early syphilis, secondary syphilis of skin or mucous membranes     ED Discharge Orders     None       Portions of this note were generated with dragon dictation software. Dictation errors may occur despite best attempts at proofreading.   Sharman Cheek, MD 07/11/21 1414

## 2021-07-19 ENCOUNTER — Emergency Department
Admission: EM | Admit: 2021-07-19 | Discharge: 2021-07-19 | Disposition: A | Discharge status: Home / Self Care | Attending: Emergency Medicine | Admitting: Emergency Medicine

## 2021-07-19 ENCOUNTER — Emergency Department

## 2021-07-19 ENCOUNTER — Other Ambulatory Visit: Payer: Self-pay

## 2021-07-19 ENCOUNTER — Encounter: Payer: Self-pay | Admitting: Emergency Medicine

## 2021-07-19 DIAGNOSIS — Z96651 Presence of right artificial knee joint: Secondary | ICD-10-CM | POA: Diagnosis not present

## 2021-07-19 DIAGNOSIS — Z20822 Contact with and (suspected) exposure to covid-19: Secondary | ICD-10-CM | POA: Insufficient documentation

## 2021-07-19 DIAGNOSIS — Z21 Asymptomatic human immunodeficiency virus [HIV] infection status: Secondary | ICD-10-CM | POA: Diagnosis not present

## 2021-07-19 DIAGNOSIS — R0602 Shortness of breath: Secondary | ICD-10-CM | POA: Diagnosis present

## 2021-07-19 DIAGNOSIS — B379 Candidiasis, unspecified: Secondary | ICD-10-CM | POA: Diagnosis not present

## 2021-07-19 DIAGNOSIS — J45901 Unspecified asthma with (acute) exacerbation: Secondary | ICD-10-CM | POA: Insufficient documentation

## 2021-07-19 HISTORY — DX: Other specified bacterial agents as the cause of diseases classified elsewhere: B96.89

## 2021-07-19 HISTORY — DX: Human immunodeficiency virus (HIV) disease: B20

## 2021-07-19 HISTORY — DX: Syphilis, unspecified: A53.9

## 2021-07-19 HISTORY — DX: Acute vaginitis: N76.0

## 2021-07-19 HISTORY — DX: Asymptomatic human immunodeficiency virus (hiv) infection status: Z21

## 2021-07-19 HISTORY — DX: Unspecified asthma, uncomplicated: J45.909

## 2021-07-19 LAB — URINALYSIS, COMPLETE (UACMP) WITH MICROSCOPIC
Bilirubin Urine: NEGATIVE
Glucose, UA: NEGATIVE mg/dL
Hgb urine dipstick: NEGATIVE
Nitrite: NEGATIVE
Specific Gravity, Urine: 1.02 (ref 1.005–1.030)
pH: 7 (ref 5.0–8.0)

## 2021-07-19 LAB — CHLAMYDIA/NGC RT PCR (ARMC ONLY)
Chlamydia Tr: NOT DETECTED
N gonorrhoeae: NOT DETECTED

## 2021-07-19 LAB — BASIC METABOLIC PANEL
Anion gap: 7 (ref 5–15)
BUN: 10 mg/dL (ref 6–20)
CO2: 25 mmol/L (ref 22–32)
Calcium: 9 mg/dL (ref 8.9–10.3)
Chloride: 105 mmol/L (ref 98–111)
Creatinine, Ser: 0.5 mg/dL (ref 0.44–1.00)
GFR, Estimated: >60 mL/min (ref >60)
Glucose, Bld: 97 mg/dL (ref 70–99)
Potassium: 4.4 mmol/L (ref 3.5–5.1)
Sodium: 137 mmol/L (ref 135–145)

## 2021-07-19 LAB — CBC
HCT: 37.1 % (ref 36.0–46.0)
Hemoglobin: 12.1 g/dL (ref 12.0–15.0)
MCH: 30.9 pg (ref 26.0–34.0)
MCHC: 32.6 g/dL (ref 30.0–36.0)
MCV: 94.9 fL (ref 80.0–100.0)
Platelets: 175 10*3/uL (ref 150–400)
RBC: 3.91 MIL/uL (ref 3.87–5.11)
RDW: 13.6 % (ref 11.5–15.5)
WBC: 6.8 10*3/uL (ref 4.0–10.5)
nRBC: 0 % (ref 0.0–0.2)

## 2021-07-19 LAB — WET PREP, GENITAL
Clue Cells Wet Prep HPF POC: NONE SEEN
Sperm: NONE SEEN
Trich, Wet Prep: NONE SEEN
WBC, Wet Prep HPF POC: >=10 — AB (ref <10)

## 2021-07-19 LAB — RESP PANEL BY RT-PCR (FLU A&B, COVID) ARPGX2
Influenza A by PCR: NEGATIVE
Influenza B by PCR: NEGATIVE
SARS Coronavirus 2 by RT PCR: NEGATIVE

## 2021-07-19 LAB — TROPONIN I (HIGH SENSITIVITY): Troponin I (High Sensitivity): 8 ng/L (ref <18)

## 2021-07-19 MED ORDER — FLUCONAZOLE 50 MG PO TABS
150.0000 mg | ORAL_TABLET | Freq: Once | ORAL | Status: AC
  Administered 2021-07-19: 150 mg via ORAL
  Filled 2021-07-19: qty 1

## 2021-07-19 MED ORDER — AZITHROMYCIN 250 MG PO TABS: ORAL_TABLET | ORAL | 0 refills | Status: AC

## 2021-07-19 MED ORDER — IPRATROPIUM-ALBUTEROL 0.5-2.5 (3) MG/3ML IN SOLN
3.0000 mL | Freq: Once | RESPIRATORY_TRACT | Status: AC
  Administered 2021-07-19: 3 mL via RESPIRATORY_TRACT
  Filled 2021-07-19: qty 3

## 2021-07-19 MED ORDER — PREDNISONE 20 MG PO TABS
40.0000 mg | ORAL_TABLET | Freq: Every day | ORAL | 0 refills | Status: AC
Start: 2021-07-19 — End: 2021-07-24

## 2021-07-19 MED ORDER — FLUCONAZOLE 100 MG PO TABS: 100.0000 mg | ORAL_TABLET | Freq: Once | ORAL | 0 refills | Status: AC

## 2021-07-19 MED ORDER — IPRATROPIUM-ALBUTEROL 0.5-2.5 (3) MG/3ML IN SOLN
3.0000 mL | Freq: Once | RESPIRATORY_TRACT | Status: AC
  Administered 2021-07-19: 3 mL via RESPIRATORY_TRACT

## 2021-07-19 MED ORDER — PREDNISONE 20 MG PO TABS
60.0000 mg | ORAL_TABLET | Freq: Once | ORAL | Status: AC
  Administered 2021-07-19: 60 mg via ORAL
  Filled 2021-07-19: qty 3

## 2021-07-19 NOTE — ED Triage Notes (Addendum)
Pt via POV from home. Pt is c/o wheezing for the past week and SOB since yesterday. Pt has a hx of asthma. Pt is from [place] and states she did  not bring her nebs but states the inhaler hasn't helped. Pt also c/o constant vaginal burning. Denies any discharge. Pt states she has a hx of BV, yeast infection, and syphilis. Pt is A&Ox4 and NAD.

## 2021-07-19 NOTE — ED Provider Notes (Signed)
[name] Emergency Department Provider Note  ____________________________________________   Event Date/Time   First MD Initiated Contact with Patient 07/19/21 1614     (approximate)  I have reviewed the triage vital signs and the nursing notes.   HISTORY  Chief Complaint Shortness of Breath    HPI Hailey Reyes is a 60 y.o. female with HIV, syphilis, asthma who comes in with concerns for shortness of breath.  Patient reports that she is from [place] and she did not bring her neb.  She reports feeling like her shortness of breath has flared up due to her asthma.  She denies any unilateral leg swelling.  She states that she has some relief with her albuterol inhaler but not as much as when she has her neb.  Worse with exertion, better at rest.  She also reports some vaginal itching.  She is concerned that she may have a yeast infection.  The flight is only 3 hours from [place] but no other long flights.  on review of records patient was treated for syphilis on 07/11/2021 with IM penicillin.  She is followed by an ID clinic in [place] who report that they were going to continue testing her every 6 months to ensure resolution.  Rash has resolved however.  Patient also showed me her chart where she was negative for gonorrhea and chlamydia.  She states that she is not been sexually active since this happened              Past Medical History:  Diagnosis Date   Asthma    Bacterial vaginosis    HIV (human immunodeficiency virus infection) (HCC)    Syphilis     There are no problems to display for this patient.   Past Surgical History:  Procedure Laterality Date   ABDOMINAL HYSTERECTOMY     CHOLECYSTECTOMY     MANDIBLE SURGERY     REVISION TOTAL KNEE ARTHROPLASTY Right     Prior to Admission medications   Not on File    Allergies Elemental sulfur  History reviewed. No pertinent family history.  Social History Social History    Tobacco Use   Smoking status: Never   Smokeless tobacco: Never  Substance Use Topics   Alcohol use: Never   Drug use: Never      Review of Systems Constitutional: No fever/chills Eyes: No visual changes. ENT: No sore throat. Cardiovascular: No chest pain Respiratory: Positive for SOB Gastrointestinal: No abdominal pain.  No nausea, no vomiting.  No diarrhea.  No constipation. Genitourinary: Negative for dysuria.  Positive vaginal itching Musculoskeletal: Negative for back pain. Skin: Negative for rash. Neurological: Negative for headaches, focal weakness or numbness. All other ROS negative ____________________________________________   PHYSICAL EXAM:  VITAL SIGNS: ED Triage Vitals  Enc Vitals Group     BP 07/19/21 1132 126/61     Pulse Rate 07/19/21 1132 94     Resp 07/19/21 1132 20     Temp 07/19/21 1132 98.5 F (36.9 C)     Temp Source 07/19/21 1132 Oral     SpO2 07/19/21 1132 100 %     Weight 07/19/21 1134 215 lb (97.5 kg)     Height 07/19/21 1134 5\' 7"  (1.702 m)     Head Circumference --      Peak Flow --      Pain Score 07/19/21 1134 0     Pain Loc --      Pain Edu? --  Excl. in GC? --     Constitutional: Alert and oriented. Well appearing and in no acute distress. Eyes: Conjunctivae are normal. EOMI. Head: Atraumatic. Nose: No congestion/rhinnorhea. Mouth/Throat: Mucous membranes are moist.   Neck: No stridor. Trachea Midline. FROM Cardiovascular: Normal rate, regular rhythm. Grossly normal heart sounds.  Good peripheral circulation. Respiratory: Patient talking in full sentences, no increased work of breathing but does have wheezing bilaterally Gastrointestinal: Soft and nontender. No distention. No abdominal bruits.  Musculoskeletal: No lower extremity tenderness nor edema.  No joint effusions. Neurologic:  Normal speech and language. No gross focal neurologic deficits are appreciated.  Skin:  Skin is warm, dry and intact. No rash  noted. Psychiatric: Mood and affect are normal. Speech and behavior are normal. GU: Patient has some cottage cheeselike discharge.  No cervical motion tenderness.  ____________________________________________   LABS (all labs ordered are listed, but only abnormal results are displayed)  Labs Reviewed  URINALYSIS, COMPLETE (UACMP) WITH MICROSCOPIC - Abnormal; Notable for the following components:      Result Value   APPearance CLEAR (*)    Ketones, ur TRACE (*)    Protein, ur TRACE (*)    Leukocytes,Ua MODERATE (*)    Bacteria, UA RARE (*)    All other components within normal limits  RESP PANEL BY RT-PCR (FLU A&B, COVID) ARPGX2  BASIC METABOLIC PANEL  CBC  TROPONIN I (HIGH SENSITIVITY)  TROPONIN I (HIGH SENSITIVITY)   ____________________________________________   ED ECG REPORT I, Concha Se, the attending physician, personally viewed and interpreted this ECG.  Normal sinus rhythm 93, no ST elevations, no T wave inversions, normal intervals ____________________________________________  RADIOLOGY Vela Prose, personally viewed and evaluated these images (plain radiographs) as part of my medical decision making, as well as reviewing the written report by the radiologist.  ED MD interpretation: No pneumonia  Official radiology report(s): DG Chest 2 View  Result Date: 07/19/2021 CLINICAL DATA:  Chest PA and shortness of breath EXAM: CHEST - 2 VIEW COMPARISON:  None. FINDINGS: The heart size and mediastinal contours are within normal limits. Both lungs are clear. The visualized skeletal structures are unremarkable. IMPRESSION: No active cardiopulmonary disease. Electronically Signed   By: Allegra Lai M.D.   On: 07/19/2021 12:14    ____________________________________________   PROCEDURES  Procedure(s) performed (including Critical Care):  Procedures   ____________________________________________   INITIAL IMPRESSION / ASSESSMENT AND PLAN / ED  COURSE   Hailey Reyes was evaluated in Emergency Department on 07/19/2021 for the symptoms described in the history of present illness. She was evaluated in the context of the global COVID-19 pandemic, which necessitated consideration that the patient might be at risk for infection with the SARS-CoV-2 virus that causes COVID-19. Institutional protocols and algorithms that pertain to the evaluation of patients at risk for COVID-19 are in a state of rapid change based on information released by regulatory bodies including the CDC and federal and state organizations. These policies and algorithms were followed during the patient's care in the ED.     Pt presents with SOB.  Given patient's wheezing bilaterally suspect that this is from her asthma.  I did review her records from her ID doctor and her CD4 count was normal just a few days ago.  Unlikely to be PCP pneumonia   differential includes: PNA-will get xray to evaluation Anemia-CBC to evaluate ACS- will get trops Arrhythmia-Will get EKG and keep on monitor.  COVID- will get testing per algorithm. PE-lower suspicion given no  risk factors and other cause more likely.  Considering given the flight but patient has no unilateral leg swelling not pleuritic and seems very consistent to her prior asthma flares  For the vaginal discharge patient's exam looks concerning for yeast infection and it was positive on her wet prep.  Low suspicion for PID.  Do not feel we need to repeat syphilis testing given recent treatment and her ID doctor is following her along for this.  Patient given 3 DuoNeb's and was reassessed.  Patient states that she is feeling better.  Offered patient admission given she does still have some wheezing but she states that she lives nearby and feels comfortable going home and will return if she develops return of her symptoms.  I have given her a one-time dose of fluconazole for her yeast infection.  We will give her some  antibiotics, steroids for at home and then 1 additional fluconazole to take after completion of the antibiotics.  She feels comfortable with this plan and will return if she develops worsening shortness of breath               ____________________________________________   FINAL CLINICAL IMPRESSION(S) / ED DIAGNOSES   Final diagnoses:  Mild asthma with exacerbation, unspecified whether persistent  Yeast infection     MEDICATIONS GIVEN DURING THIS VISIT:  Medications  ipratropium-albuterol (DUONEB) 0.5-2.5 (3) MG/3ML nebulizer solution 3 mL (3 mLs Nebulization Given 07/19/21 1643)  predniSONE (DELTASONE) tablet 60 mg (60 mg Oral Given 07/19/21 1643)  ipratropium-albuterol (DUONEB) 0.5-2.5 (3) MG/3ML nebulizer solution 3 mL (3 mLs Nebulization Given 07/19/21 1750)  ipratropium-albuterol (DUONEB) 0.5-2.5 (3) MG/3ML nebulizer solution 3 mL (3 mLs Nebulization Given 07/19/21 1750)  fluconazole (DIFLUCAN) tablet 150 mg (150 mg Oral Given 07/19/21 1854)     ED Discharge Orders          Ordered    predniSONE (DELTASONE) 20 MG tablet  Daily with breakfast        07/19/21 1901    azithromycin (ZITHROMAX Z-PAK) 250 MG tablet        07/19/21 1901    fluconazole (DIFLUCAN) 100 MG tablet   Once        07/19/21 1901             Note:  This document was prepared using Dragon voice recognition software and may include unintentional dictation errors.   Concha Se, MD 07/19/21 1901

## 2021-07-19 NOTE — Discharge Instructions (Addendum)
We offered admission for your shortness of breath versus going home and you felt more comfortable going home.    Take the steroids and antibiotics to help with your asthma flare.  Take the additional dose of fluconazole after the antibiotics are completed to help clear up any yeast infection.  Use your inhaler at home every 4-6 hours.  Return to the ER if develop worsening shortness of breath.

## 2021-07-19 NOTE — ED Notes (Signed)
Called lab to send swabs for wet prep and also gc/chlam swabs as pelvic cart does not have any.

## 2021-07-19 NOTE — ED Provider Notes (Signed)
°  Emergency Medicine Provider Triage Evaluation Note  Hailey Reyes , a 60 y.o.female,  was evaluated in triage.  Pt complains of wheezing.  Patient states that she has been wheezing for the past week.  In addition, she is complaining of vaginal burning with abnormal discharge.   Review of Systems  Positive: Vaginal itching Negative: Denies fever, chest pain, vomiting  Physical Exam   Vitals:   07/19/21 1132  BP: 126/61  Pulse: 94  Resp: 20  Temp: 98.5 F (36.9 C)  SpO2: 100%   Gen:   Awake, no distress   Resp:  Normal effort  MSK:   Moves extremities without difficulty  Other:    Medical Decision Making  Given the patient's initial medical screening exam, the following diagnostic evaluation has been ordered. The patient will be placed in the appropriate treatment space, once one is available, to complete the evaluation and treatment. I have discussed the plan of care with the patient and I have advised the patient that an ED physician or mid-level practitioner will reevaluate their condition after the test results have been received, as the results may give them additional insight into the type of treatment they may need.    Diagnostics: Labs, UA, CXR, EKG  Treatments: none immediately   Varney Daily, [place] 07/19/21 1154    Concha Se, MD 07/19/21 1210

## 2021-07-19 NOTE — ED Notes (Signed)
Pelvic cart @ bedside.  

## 2021-07-19 NOTE — ED Notes (Signed)
Pt to desk to request breathing tx repeatedly, [place], [place] to lobby to assess patient and need for breathing treatment, per [place], [place], no need for breathing treatment at this time.

## 2022-04-30 LAB — HEMOGLOBIN A1C: A1c: 5.5

## 2022-05-06 LAB — COMPREHENSIVE METABOLIC PANEL: EGFR: 80

## 2023-01-08 ENCOUNTER — Other Ambulatory Visit
Admission: RE | Admit: 2023-01-08 | Discharge: 2023-01-08 | Disposition: A | Discharge status: Home / Self Care | Attending: Infectious Diseases | Admitting: Infectious Diseases

## 2023-01-08 ENCOUNTER — Ambulatory Visit: Attending: Infectious Diseases | Admitting: Infectious Diseases

## 2023-01-08 ENCOUNTER — Encounter: Payer: Self-pay | Admitting: Infectious Diseases

## 2023-01-08 VITALS — BP 129/87 | HR 94 | Temp 98.8°F | Wt 208.0 lb

## 2023-01-08 DIAGNOSIS — Z90710 Acquired absence of both cervix and uterus: Secondary | ICD-10-CM | POA: Diagnosis not present

## 2023-01-08 DIAGNOSIS — J45909 Unspecified asthma, uncomplicated: Secondary | ICD-10-CM | POA: Insufficient documentation

## 2023-01-08 DIAGNOSIS — Z21 Asymptomatic human immunodeficiency virus [HIV] infection status: Secondary | ICD-10-CM | POA: Insufficient documentation

## 2023-01-08 DIAGNOSIS — Z9049 Acquired absence of other specified parts of digestive tract: Secondary | ICD-10-CM | POA: Diagnosis not present

## 2023-01-08 DIAGNOSIS — I10 Essential (primary) hypertension: Secondary | ICD-10-CM | POA: Diagnosis present

## 2023-01-08 DIAGNOSIS — Z96651 Presence of right artificial knee joint: Secondary | ICD-10-CM

## 2023-01-08 DIAGNOSIS — B20 Human immunodeficiency virus [HIV] disease: Secondary | ICD-10-CM

## 2023-01-08 DIAGNOSIS — E785 Hyperlipidemia, unspecified: Secondary | ICD-10-CM | POA: Diagnosis not present

## 2023-01-08 DIAGNOSIS — Z113 Encounter for screening for infections with a predominantly sexual mode of transmission: Secondary | ICD-10-CM

## 2023-01-08 DIAGNOSIS — Z79899 Other long term (current) drug therapy: Secondary | ICD-10-CM

## 2023-01-08 LAB — CBC WITH DIFFERENTIAL/PLATELET
Abs Immature Granulocytes: 0.01 10*3/uL (ref 0.00–0.07)
Basophils Absolute: 0.1 10*3/uL (ref 0.0–0.1)
Basophils Relative: 1 %
Eosinophils Absolute: 0.4 10*3/uL (ref 0.0–0.5)
Eosinophils Relative: 6 %
HCT: 37 % (ref 36.0–46.0)
Hemoglobin: 12.2 g/dL (ref 12.0–15.0)
Immature Granulocytes: 0 %
Lymphocytes Relative: 30 %
Lymphs Abs: 1.7 10*3/uL (ref 0.7–4.0)
MCH: 30.5 pg (ref 26.0–34.0)
MCHC: 33 g/dL (ref 30.0–36.0)
MCV: 92.5 fL (ref 80.0–100.0)
Monocytes Absolute: 0.4 10*3/uL (ref 0.1–1.0)
Monocytes Relative: 7 %
Neutro Abs: 3.2 10*3/uL (ref 1.7–7.7)
Neutrophils Relative %: 56 %
Platelets: 324 10*3/uL (ref 150–400)
RBC: 4 MIL/uL (ref 3.87–5.11)
RDW: 12.6 % (ref 11.5–15.5)
WBC: 5.7 10*3/uL (ref 4.0–10.5)
nRBC: 0 % (ref 0.0–0.2)

## 2023-01-08 LAB — LIPID PANEL
Cholesterol: 119 mg/dL (ref 0–200)
HDL: 34 mg/dL — ABNORMAL LOW (ref >40)
LDL Cholesterol: 70 mg/dL (ref 0–99)
Total CHOL/HDL Ratio: 3.5 RATIO
Triglycerides: 74 mg/dL (ref <150)
VLDL: 15 mg/dL (ref 0–40)

## 2023-01-08 LAB — HEPATITIS C ANTIBODY: HCV Ab: NONREACTIVE

## 2023-01-08 MED ORDER — AMLODIPINE BESYLATE 2.5 MG PO TABS: 2.5000 mg | ORAL_TABLET | Freq: Every day | ORAL | 0 refills | Status: DC

## 2023-01-08 MED ORDER — ATORVASTATIN CALCIUM 10 MG PO TABS
10.0000 mg | ORAL_TABLET | Freq: Every day | ORAL | 0 refills | Status: DC
Start: 2023-01-08 — End: 2023-01-12

## 2023-01-08 MED ORDER — GENVOYA 150-150-200-10 MG PO TABS: 1.0000 | ORAL_TABLET | Freq: Every day | ORAL | 0 refills | Status: AC

## 2023-01-08 NOTE — Progress Notes (Signed)
NAME: Hailey Reyes  DOB: 02-Dec-1960  MRN: [id number]  Date/Time: 01/08/2023 9:03 AM  Subjective:   ?pt is transferring her care to me- She moved to [place] from [place] I reviewed the medical records from her previous provider  Hailey Reyes is a 63 y.o.female  with a history of HIV, HTN, HLD, asthma , Chronic LBA, Rt TKA, treated syphilis in 2023, on genvoya and last Cd4 fromJAN 8, 2024  is 758(39%) and VL undetectable is on genvoya and is 100% adherent  Last seen by her previous provider 10/31/22 HIV diagnosed 1990 Pt did not take meds for the first few years Nadir Cd4 200s VL - she says it has always been undetectable OI none HAARt history- Dont know the entire treatment history Was on genvoya for a long time and wa s[facility] to [facility] 2024 but did not like how seh felt- tired and lack of energy- so went back to [place] Acquired thru heterosexual contact Genotype- DK  RPR1/06/2023 1:4- treated for syphilis in Dec 2022 when it was 1: 126 ? Past Medical History:  Diagnosis Date   Asthma    Bacterial vaginosis    HIV (human immunodeficiency virus infection) (HCC)    Syphilis    Respiratory failure due to crack Cocaine and was intubated for a month tracheostomy - in ? 2016 in [place] Stopped cocaine in 2021 Past Surgical History:  Procedure Laterality Date   ABDOMINAL HYSTERECTOMY     CHOLECYSTECTOMY     MANDIBLE SURGERY     REVISION TOTAL KNEE ARTHROPLASTY Right    Bladder surgery X4 for prolapse Tubal ligation  SH Non smoker No alcohol Crack cocaine in the past Clean 3 years  Single currently Was married twice  1st husband was killed 2nd husband deceased Has 2 grown up children She has  lived in [place], [place]- likes the cold she says   FH Mother ( living) HTN Father ( living- 10 yrs dementia Brother MI  Current medications Albuterol Amlodipine 5mg  Atorvastatin 10mg  Genvoya Premarin vaginal cream Cyclobenzaprine PRN  Allergies   Allergen Reactions   Elemental Sulfur Rash   REVIEW OF SYSTEMS:  Const: negative fever, negative chills, negative weight loss Eyes: negative diplopia or visual changes, negative eye pain ENT: negative coryza, negative sore throat Resp: negative cough, hemoptysis, dyspnea Cards: negative for chest pain, palpitations, lower extremity edema GU: negative for frequency, dysuria and hematuria Skin: negative for rash and pruritus Heme: negative for easy bruising and gum/nose bleeding MS: negative for myalgias, arthralgias, back pain and muscle weakness Neurolo:negative for headaches, dizziness, vertigo, memory problems  Psych: negative for feelings of anxiety, depression   Objective:  VITALS:  BP 129/87   Pulse 94   Temp 98.8 F (37.1 C) (Oral)   Wt 208 lb (94.3 kg)   SpO2 97%   BMI 32.58 kg/m  PHYSICAL EXAM:  General: Alert, cooperative, no distress, appears stated age.  Head: Normocephalic, without obvious abnormality, atraumatic. Eyes: Conjunctivae clear, anicteric sclerae. Pupils are equal Nose: Nares normal. No drainage or sinus tenderness. Throat: Lips, mucosa, and tongue normal. No Thrush Neck: Supple, symmetrical, no adenopathy, thyroid: non tender no carotid bruit and no JVD. Back: No CVA tenderness. Lungs: Clear to auscultation bilaterally. No Wheezing or Rhonchi. No rales. Heart: Regular rate and rhythm, no murmur, rub or gallop. Abdomen: Soft, non-tender,not distended. Bowel sounds normal. No masses Extremities: Extremities normal, atraumatic, no cyanosis. No edema. No clubbing Skin: No rashes or lesions. Not Jaundiced Lymph: Cervical, supraclavicular normal. Neurologic: Grossly non-focal  Pertinent Labs IMAGING RESULTS: Health maintenance Vaccination  Vaccine Date last given comment  Influenza Flu vaccine oct 2023   Hepatitis B    Hepatitis A    Prevnar-PCV-13    Pneumovac-PPSV-23    TdaP    HPV    Shingrix ( zoster vaccine)      ______________________  Labs Lab Result  Date comment  HIV VL < 20 08/2022   CD4 758 08/2022   Genotype     [id number]     HIV antibody     RPR 1:4    Quantiferon Gold     Hep C ab     Hepatitis B-ab,ag,c     Hepatitis A-IgM, IgG /T     Lipid     GC/CHL     PAP     HB,PLT,Cr, LFT       Preventive  Procedure Result  Date comment  colonoscopy N 2023 In [place]  Mammogram     Dental exam     Opthal       Impression/Recommendation HIV - first visit  transferring care to me. 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 ?VAccine status to be updated She ahs taken Flu and Tdap But no pneumococcal vaccine  Mammogram ordered  She needs to get a PCP- gave her phone number of 4 providers with [facility]?- she will make appt  ___________________________________________________ Discussed with patient in detail Total time spent on this visit  face to face with patient was 60 min Follow up 3 months

## 2023-01-08 NOTE — Patient Instructions (Addendum)
You are here to engage in HIV care You are on genvoya Today will do labs Here is the information of primary care docs   1) [facility] Address: [address], [place], [place] [address] Phone: [phone]  2) Dr.Jones [phone]   3) Dr.karmalegos MD  [address] [place], [place] [address] Phone: [phone]   4) [facility] [address] [place], [place] [address] [phone]

## 2023-01-09 LAB — T-HELPER CELLS CD4/CD8 %
% CD 4 Pos. Lymph.: 38.2 % (ref 30.8–58.5)
Absolute CD 4 Helper: 649 /uL (ref 359–1519)
Basophils Absolute: 0.1 10*3/uL (ref 0.0–0.2)
Basos: 1 %
CD3+CD4+ Cells/CD3+CD8+ Cells Bld: 0.92 (ref 0.92–3.72)
CD3+CD8+ Cells # Bld: 707 /uL (ref 109–897)
CD3+CD8+ Cells NFr Bld: 41.6 % — ABNORMAL HIGH (ref 12.0–35.5)
EOS (ABSOLUTE): 0.3 10*3/uL (ref 0.0–0.4)
Eos: 6 %
Hematocrit: 38.2 % (ref 34.0–46.6)
Hemoglobin: 13 g/dL (ref 11.1–15.9)
Immature Grans (Abs): 0 10*3/uL (ref 0.0–0.1)
Immature Granulocytes: 0 %
Lymphocytes Absolute: 1.7 10*3/uL (ref 0.7–3.1)
Lymphs: 30 %
MCH: 31 pg (ref 26.6–33.0)
MCHC: 34 g/dL (ref 31.5–35.7)
MCV: 91 fL (ref 79–97)
Monocytes Absolute: 0.4 10*3/uL (ref 0.1–0.9)
Monocytes: 7 %
Neutrophils Absolute: 3.1 10*3/uL (ref 1.4–7.0)
Neutrophils: 56 %
Platelets: 249 10*3/uL (ref 150–450)
RBC: 4.19 x10E6/uL (ref 3.77–5.28)
RDW: 12.9 % (ref 11.7–15.4)
WBC: 5.5 10*3/uL (ref 3.4–10.8)

## 2023-01-09 LAB — HEMOGLOBIN A1C
Hgb A1c MFr Bld: 5.8 % — ABNORMAL HIGH (ref 4.8–5.6)
Mean Plasma Glucose: 120 mg/dL

## 2023-01-09 LAB — RPR
RPR Ser Ql: REACTIVE — AB
RPR Titer: 1:2 {titer}

## 2023-01-10 LAB — HIV-1 RNA QUANT-NO REFLEX-BLD
HIV 1 RNA Quant: <20 copies/mL
LOG10 HIV-1 RNA: UNDETERMINED log10copy/mL

## 2023-01-12 ENCOUNTER — Other Ambulatory Visit: Payer: Self-pay

## 2023-01-12 ENCOUNTER — Ambulatory Visit (INDEPENDENT_AMBULATORY_CARE_PROVIDER_SITE_OTHER): Admitting: Nurse Practitioner

## 2023-01-12 ENCOUNTER — Other Ambulatory Visit: Payer: Self-pay | Admitting: Nurse Practitioner

## 2023-01-12 ENCOUNTER — Encounter: Payer: Self-pay | Admitting: Nurse Practitioner

## 2023-01-12 VITALS — BP 132/74 | HR 96 | Temp 98.4°F | Resp 16 | Ht 67.0 in | Wt 210.2 lb

## 2023-01-12 DIAGNOSIS — R531 Weakness: Secondary | ICD-10-CM

## 2023-01-12 DIAGNOSIS — J4520 Mild intermittent asthma, uncomplicated: Secondary | ICD-10-CM

## 2023-01-12 DIAGNOSIS — N63 Unspecified lump in unspecified breast: Secondary | ICD-10-CM

## 2023-01-12 DIAGNOSIS — Z21 Asymptomatic human immunodeficiency virus [HIV] infection status: Secondary | ICD-10-CM

## 2023-01-12 DIAGNOSIS — E782 Mixed hyperlipidemia: Secondary | ICD-10-CM

## 2023-01-12 DIAGNOSIS — M25551 Pain in right hip: Secondary | ICD-10-CM | POA: Insufficient documentation

## 2023-01-12 DIAGNOSIS — I10 Essential (primary) hypertension: Secondary | ICD-10-CM | POA: Diagnosis not present

## 2023-01-12 DIAGNOSIS — N951 Menopausal and female climacteric states: Secondary | ICD-10-CM

## 2023-01-12 DIAGNOSIS — Z1231 Encounter for screening mammogram for malignant neoplasm of breast: Secondary | ICD-10-CM

## 2023-01-12 DIAGNOSIS — M545 Low back pain, unspecified: Secondary | ICD-10-CM

## 2023-01-12 DIAGNOSIS — M25561 Pain in right knee: Secondary | ICD-10-CM

## 2023-01-12 DIAGNOSIS — G8929 Other chronic pain: Secondary | ICD-10-CM | POA: Insufficient documentation

## 2023-01-12 DIAGNOSIS — R29810 Facial weakness: Secondary | ICD-10-CM

## 2023-01-12 DIAGNOSIS — M25552 Pain in left hip: Secondary | ICD-10-CM

## 2023-01-12 LAB — T.PALLIDUM AB, TOTAL: T Pallidum Abs: REACTIVE — AB

## 2023-01-12 LAB — QUANTIFERON-TB GOLD PLUS: QuantiFERON-TB Gold Plus: NEGATIVE

## 2023-01-12 LAB — QUANTIFERON-TB GOLD PLUS (RQFGPL)
QuantiFERON Mitogen Value: >10 IU/mL
QuantiFERON Nil Value: 0.03 IU/mL
QuantiFERON TB1 Ag Value: 0.03 IU/mL
QuantiFERON TB2 Ag Value: 0.04 IU/mL

## 2023-01-12 MED ORDER — ESTROGENS CONJUGATED 0.625 MG/GM VA CREA
1.0000 | TOPICAL_CREAM | Freq: Every day | VAGINAL | 5 refills | Status: DC
Start: 2023-01-12 — End: 2023-01-21

## 2023-01-12 MED ORDER — CYCLOBENZAPRINE HCL 10 MG PO TABS
10.0000 mg | ORAL_TABLET | Freq: Three times a day (TID) | ORAL | 3 refills | Status: AC
Start: 2023-01-12 — End: ?

## 2023-01-12 MED ORDER — ALBUTEROL SULFATE HFA 108 (90 BASE) MCG/ACT IN AERS
2.0000 | INHALATION_SPRAY | RESPIRATORY_TRACT | 6 refills | Status: AC | PRN
Start: 2023-01-12 — End: ?

## 2023-01-12 MED ORDER — AMLODIPINE BESYLATE 2.5 MG PO TABS
2.5000 mg | ORAL_TABLET | Freq: Every day | ORAL | 1 refills | Status: AC
Start: 2023-01-12 — End: ?

## 2023-01-12 MED ORDER — ATORVASTATIN CALCIUM 10 MG PO TABS
10.0000 mg | ORAL_TABLET | Freq: Every day | ORAL | 1 refills | Status: AC
Start: 2023-01-12 — End: ?

## 2023-01-12 NOTE — Assessment & Plan Note (Signed)
Handicap sticker form filled out for patient, refill of flexeril sent in

## 2023-01-12 NOTE — Progress Notes (Signed)
BP 132/74   Pulse 96   Temp 98.4 F (36.9 C) (Oral)   Resp 16   Ht 5\' 7"  (1.702 m)   Wt 210 lb 3.2 oz (95.3 kg)   SpO2 98%   BMI 32.92 kg/m    Subjective:    Patient ID: Hailey Reyes, female    DOB: 02-10-1961, 62 y.o.   MRN: [id number]  HPI: Hailey Reyes is a 62 y.o. female  Chief Complaint  Patient presents with   Establish Care   Hypertension   Hyperlipidemia    Medication refills   HIV Positive/AIDS    Discuss medication    Establish care: her last physical was within the last year.  Medical history includes HIV, HLD, HTN, asthma, chronic back, hip and knee pain.  Family history includes no cancers, HTN, DM.  Health maintenance recently had labs done, .  Recently moved her from [place].   HIV: she is currently on Genvoya daily. She is established with ID and last saw them on 01/08/2023. Patient reports she is doing well.  Last CD4 was 649.   Hypertension:  -Medications: amlodipine 2.5 mg daily -Patient is compliant with above medications and reports no side effects. -Checking BP at home (average): does not check b/p at home she says her blood pressure will go up when she is sick.  Today her blood pressure is 132/74 -Denies any SOB, CP, vision changes, LE edema or symptoms of hypotension    01/12/2023    9:49 AM 01/08/2023    8:44 AM 07/19/2021    6:55 PM  Vitals with BMI  Height 5\' 7"     Weight 210 lbs 3 oz 208 lbs   BMI 32.91    Systolic 132 129 409  Diastolic 74 87 85  Pulse 96 94 95    Asthma:    -Asthma status: stable -Current Treatments: albuterol -Satisfied with current treatment?: yes -Albuterol/rescue inhaler frequency: occasionally -Dyspnea frequency: only when she is sick -Wheezing frequency:only when she is sick -Cough frequency: only when she is sick -Nocturnal symptom frequency:  -Limitation of activity: no -Current upper respiratory symptoms: no -Triggers: illness -Aerochamber/spacer use: no -Visits to ER or Urgent  Care in past year: no -Pneumovax: Up to Date -Influenza: Up to Date   HLD:  -Medications: atorvastatin 10 mg daily -Patient is compliant with above medications and reports no side effects.  -Last lipid panel:   Lipid Panel     Component Value Date/Time   CHOL 119 01/08/2023 0927   TRIG 74 01/08/2023 0927   HDL 34 (L) 01/08/2023 0927   CHOLHDL 3.5 01/08/2023 0927   VLDL 15 01/08/2023 0927   LDLCALC 70 01/08/2023 0927     Chronic back/hip/knee pain: arthritis in her back.  She says that she has struggled with this for years.   she currently takes flexeril 10 mg three times a day as needed for muscle spasms.  She would like a handicap sticker.   Menopausal/vaginal dryness: she is currently on premarin cream.  She says that she does well with that.   Left side weakness/facial droop: patient reports that in August of last year one of her friends noticed a facial droop but did not tell her. Patient reports that she is concerned that she had a stroke.  She says she has left sided weakness that has not gotten better since then. She denies any numbness or tingling, she says just weakness. Will order ct scan and refer to neurology.  01/12/2023    9:55 AM 01/08/2023    8:50 AM  Depression screen PHQ 2/9  Decreased Interest 0 0  Down, Depressed, Hopeless 0 0  PHQ - 2 Score 0 0    Relevant past medical, surgical, family and social history reviewed and updated as indicated. Interim medical history since our last visit reviewed. Allergies and medications reviewed and updated.  Review of Systems  Constitutional: Negative for fever or weight change.  Respiratory: Negative for cough and shortness of breath.   Cardiovascular: Negative for chest pain or palpitations.  Gastrointestinal: Negative for abdominal pain, no bowel changes.  Musculoskeletal: Negative for gait problem or joint swelling.  Skin: Negative for rash.  Neurological: Negative for dizziness or headache.  No other specific  complaints in a complete review of systems (except as listed in HPI above).      Objective:    BP 132/74   Pulse 96   Temp 98.4 F (36.9 C) (Oral)   Resp 16   Ht 5\' 7"  (1.702 m)   Wt 210 lb 3.2 oz (95.3 kg)   SpO2 98%   BMI 32.92 kg/m   Wt Readings from Last 3 Encounters:  01/12/23 210 lb 3.2 oz (95.3 kg)  01/08/23 208 lb (94.3 kg)  07/19/21 215 lb (97.5 kg)    Physical Exam  Constitutional: Patient appears well-developed and well-nourished. Obese  No distress.  HEENT: head atraumatic, normocephalic, pupils equal and reactive to light, neck supple,  Cardiovascular: Normal rate, regular rhythm and normal heart sounds.  No murmur heard. No BLE edema. Pulmonary/Chest: Effort normal and breath sounds normal. No respiratory distress. Abdominal: Soft.  There is no tenderness. Neuro: right side strength 5/5, left side 4/5, no arm drift, no facial droop noted, normal sensation Psychiatric: Patient has a normal mood and affect. behavior is normal. Judgment and thought content normal.  Results for orders placed or performed during the hospital encounter of 01/08/23  Hepatitis C antibody  Result Value Ref Range   HCV Ab NON REACTIVE NON REACTIVE  CBC with Differential/Platelet  Result Value Ref Range   WBC 5.7 4.0 - 10.5 K/uL   RBC 4.00 3.87 - 5.11 MIL/uL   Hemoglobin 12.2 12.0 - 15.0 g/dL   HCT 16.1 09.6 - 04.5 %   MCV 92.5 80.0 - 100.0 fL   MCH 30.5 26.0 - 34.0 pg   MCHC 33.0 30.0 - 36.0 g/dL   RDW 40.9 81.1 - 91.4 %   Platelets 324 150 - 400 K/uL   nRBC 0.0 0.0 - 0.2 %   Neutrophils Relative % 56 %   Neutro Abs 3.2 1.7 - 7.7 K/uL   Lymphocytes Relative 30 %   Lymphs Abs 1.7 0.7 - 4.0 K/uL   Monocytes Relative 7 %   Monocytes Absolute 0.4 0.1 - 1.0 K/uL   Eosinophils Relative 6 %   Eosinophils Absolute 0.4 0.0 - 0.5 K/uL   Basophils Relative 1 %   Basophils Absolute 0.1 0.0 - 0.1 K/uL   Immature Granulocytes 0 %   Abs Immature Granulocytes 0.01 0.00 - 0.07 K/uL  RPR   Result Value Ref Range   RPR Ser Ql Reactive (A) NON REACTIVE   RPR Titer 1:2   HIV-1 RNA quant-no reflex-bld  Result Value Ref Range   HIV 1 RNA Quant <20 copies/mL   LOG10 HIV-1 RNA UNABLE TO CALCULATE log10copy/mL  T-helper cells CD4/CD8 %  Result Value Ref Range   Absolute CD 4 Helper 649 359 - 1,519 /  uL   % CD 4 Pos. Lymph. 38.2 30.8 - 58.5 %   CD3+CD8+ Cells # Bld 707 109 - 897 /uL   CD3+CD8+ Cells NFr Bld 41.6 (H) 12.0 - 35.5 %   CD3+CD4+ Cells/CD3+CD8+ Cells Bld 0.92 0.92 - 3.72   WBC 5.5 3.4 - 10.8 x10E3/uL   RBC 4.19 3.77 - 5.28 x10E6/uL   Hemoglobin 13.0 11.1 - 15.9 g/dL   Hematocrit 16.1 09.6 - 46.6 %   MCV 91 79 - 97 fL   MCH 31.0 26.6 - 33.0 pg   MCHC 34.0 31.5 - 35.7 g/dL   RDW 04.5 40.9 - 81.1 %   Platelets 249 150 - 450 x10E3/uL   Neutrophils 56 Not Estab. %   Lymphs 30 Not Estab. %   Monocytes 7 Not Estab. %   Eos 6 Not Estab. %   Basos 1 Not Estab. %   Neutrophils Absolute 3.1 1.4 - 7.0 x10E3/uL   Lymphocytes Absolute 1.7 0.7 - 3.1 x10E3/uL   Monocytes Absolute 0.4 0.1 - 0.9 x10E3/uL   EOS (ABSOLUTE) 0.3 0.0 - 0.4 x10E3/uL   Basophils Absolute 0.1 0.0 - 0.2 x10E3/uL   Immature Granulocytes 0 Not Estab. %   Immature Grans (Abs) 0.0 0.0 - 0.1 x10E3/uL  Hemoglobin A1c  Result Value Ref Range   Hgb A1c MFr Bld 5.8 (H) 4.8 - 5.6 %   Mean Plasma Glucose 120 mg/dL  Lipid panel  Result Value Ref Range   Cholesterol 119 0 - 200 mg/dL   Triglycerides 74 <914 mg/dL   HDL 34 (L) >78 mg/dL   Total CHOL/HDL Ratio 3.5 RATIO   VLDL 15 0 - 40 mg/dL   LDL Cholesterol 70 0 - 99 mg/dL      Assessment & Plan:   Problem List Items Addressed This Visit       Cardiovascular and Mediastinum   Essential hypertension    Continue taking amlodipine 2.5 mg daily.        Relevant Medications   atorvastatin (LIPITOR) 10 MG tablet   amLODipine (NORVASC) 2.5 MG tablet     Respiratory   Moderate persistent asthma with (acute) exacerbation    Uses albuterol inhaler  as needed      Relevant Medications   albuterol (VENTOLIN HFA) 108 (90 Base) MCG/ACT inhaler     Other   Chronic bilateral low back pain without sciatica    Handicap sticker form filled out for patient, refill of flexeril sent in      Relevant Medications   cyclobenzaprine (FLEXERIL) 10 MG tablet   Asymptomatic human immunodeficiency virus (hiv) infection status (HCC)    Established with ID,  currently on Genvoya.       Relevant Medications   albuterol (VENTOLIN HFA) 108 (90 Base) MCG/ACT inhaler   Bilateral hip pain - Primary    Handicap sticker form filled out for patient, refill of flexeril sent in      Relevant Medications   cyclobenzaprine (FLEXERIL) 10 MG tablet   Chronic pain of right knee    Handicap sticker form filled out for patient, refill of flexeril sent in      Relevant Medications   cyclobenzaprine (FLEXERIL) 10 MG tablet   Other Visit Diagnoses     Encounter for screening mammogram for malignant neoplasm of breast       Relevant Orders   MM 3D SCREENING MAMMOGRAM BILATERAL BREAST   Vaginal dryness, menopausal       Relevant Medications   conjugated estrogens (  PREMARIN) vaginal cream   Mixed hyperlipidemia       Relevant Medications   atorvastatin (LIPITOR) 10 MG tablet   amLODipine (NORVASC) 2.5 MG tablet   Left-sided weakness       Relevant Orders   CT HEAD WO CONTRAST ( )   Ambulatory referral to Neurology   Facial droop       Relevant Orders   CT HEAD WO CONTRAST ( )   Ambulatory referral to Neurology        Follow up plan: Return in about 4 months (around 05/14/2023) for follow up.

## 2023-01-12 NOTE — Assessment & Plan Note (Signed)
Continue taking amlodipine 2.5 mg daily.

## 2023-01-12 NOTE — Assessment & Plan Note (Signed)
Uses albuterol inhaler as needed ?

## 2023-01-12 NOTE — Assessment & Plan Note (Signed)
Established with ID,  currently on Genvoya.

## 2023-01-14 ENCOUNTER — Telehealth: Payer: Self-pay

## 2023-01-14 NOTE — Telephone Encounter (Signed)
Patient advised of lab results and verbalized understanding. Patient had no questions Hailey Reyes T Hailey Reyes  

## 2023-01-14 NOTE — Telephone Encounter (Signed)
-----   Message from Lynn Ito, MD sent at 01/13/2023  1:50 PM EDT ----- Can you let her know that VL < 20 , Cd4 > 600 and other labs look good. thx ----- Message ----- From: Leory Plowman, [facility] [place] Sent: 01/08/2023   9:37 AM EDT To: Lynn Ito, MD

## 2023-01-15 ENCOUNTER — Inpatient Hospital Stay
Admission: RE | Admit: 2023-01-15 | Discharge: 2023-01-15 | Disposition: A | Discharge status: Home / Self Care | Payer: Self-pay | Source: Ambulatory Visit | Attending: Nurse Practitioner | Admitting: Nurse Practitioner

## 2023-01-15 ENCOUNTER — Other Ambulatory Visit: Payer: Self-pay | Admitting: *Deleted

## 2023-01-15 DIAGNOSIS — Z1231 Encounter for screening mammogram for malignant neoplasm of breast: Secondary | ICD-10-CM

## 2023-01-16 ENCOUNTER — Other Ambulatory Visit: Payer: Self-pay | Admitting: Nurse Practitioner

## 2023-01-16 DIAGNOSIS — N951 Menopausal and female climacteric states: Secondary | ICD-10-CM

## 2023-01-19 NOTE — Telephone Encounter (Signed)
Requested medication (s) are due for refill today:   New  rx   Pharmacy needing clarification  Requested medication (s) are on the active medication list:   Yes  Future visit scheduled:   Yes   Last ordered: 01/12/2023 42.5 g, 5 refills  See pharmacy note.     Requested Prescriptions  Pending Prescriptions Disp Refills   PREMARIN vaginal cream [Pharmacy Med Name: conjugated estrogens (PREMARIN) vaginal cream] 30 g 6    Sig: Place ___ gram vaginally daily for 2 weeks then place ___ gram vaginally  ___ times a week (please specify number of grams per dose and how many  times patient should use per week for maintenance therapy)     OB/GYN:  Estrogens Failed - 01/16/2023  5:05 PM      Failed - Mammogram is up-to-date per Health Maintenance      Passed - Last BP in normal range    BP Readings from Last 1 Encounters:  01/12/23 132/74         Passed - Valid encounter within last 12 months    Recent Outpatient Visits           1 week ago Bilateral hip pain   [facility] Berniece Salines, FNP       Future Appointments             In 2 months Lynn Ito, MD [facility]   In 3 months Zane Herald, Rudolpho Sevin, FNP [facility]

## 2023-01-20 ENCOUNTER — Ambulatory Visit
Admission: RE | Admit: 2023-01-20 | Discharge: 2023-01-20 | Disposition: A | Discharge status: Home / Self Care | Source: Ambulatory Visit | Attending: Nurse Practitioner | Admitting: Nurse Practitioner

## 2023-01-20 DIAGNOSIS — R29810 Facial weakness: Secondary | ICD-10-CM | POA: Diagnosis present

## 2023-01-20 DIAGNOSIS — R531 Weakness: Secondary | ICD-10-CM | POA: Insufficient documentation

## 2023-01-20 DIAGNOSIS — N63 Unspecified lump in unspecified breast: Secondary | ICD-10-CM

## 2023-01-21 ENCOUNTER — Ambulatory Visit: Payer: Self-pay | Admitting: *Deleted

## 2023-01-21 NOTE — Telephone Encounter (Signed)
  Chief Complaint: patient requesting advised regarding vaginal pH Symptoms: patient states she is sure she does not have BV- she feels her pH is off- she stopped her premarin cream- she was in [place] and could not get it. She has ordered it to restart.. Patient wants to know what she can use to help her pH- she states she would put bleach in her vagina if she could due to the smell. Patient is not sexually active. Patient does have slight discharge. Frequency: 6 months Pertinent Negatives: Patient denies fever, itching, vaginal bleeding, pain with urination, injury to genital area Disposition: [] ED /[] Urgent Care (no appt availability in office) / [] Appointment(In office/virtual)/ []  [facility]/ [] Home Care/ [x] Refused Recommended Disposition /[] [facility]/ []  Follow-up with PCP Additional Notes: Offered appointment- patient states she is sure she does not have BV- she states that would be a waste of time and money- advised I would send message for provider recommendation- but she will want to see her. Any OTC suggestions to assist patient with her vaginal odor?

## 2023-01-21 NOTE — Telephone Encounter (Signed)
Summary: contact req / personal discomfort   The patient would like to be contacted by a member of clinical staff when possible to discuss their ph imbalance and concerns  Please contact further when possible         Reason for Disposition  [1] Vaginal odor (bad smell) AND [2] not improved > 3 days following Care Advice  Answer Assessment - Initial Assessment Questions 1. SYMPTOM: "What's the main symptom you're concerned about?" (e.g., pain, itching, dryness)     Odor from vagina, slight discharge 2. LOCATION: "Where is the  odor located?" (e.g., inside/outside, left/right)     From the vagina 3. ONSET: "When did the  odor  start?"     6 months 4. PAIN: "Is there any pain?" If Yes, ask: "How bad is it?" (Scale: 1-10; mild, moderate, severe)   -  MILD (1-3): Doesn't interfere with normal activities.    -  MODERATE (4-7): Interferes with normal activities (e.g., work or school) or awakens from sleep.     -  SEVERE (8-10): Excruciating pain, unable to do any normal activities.     no 5. ITCHING: "Is there any itching?" If Yes, ask: "How bad is it?" (Scale: 1-10; mild, moderate, severe)     no 6. CAUSE: "What do you think is causing the discharge?" "Have you had the same problem before? What happened then?"     Patient feels pH is off and that is causing the odor 7. OTHER SYMPTOMS: "Do you have any other symptoms?" (e.g., fever, itching, vaginal bleeding, pain with urination, injury to genital area, vaginal foreign body)     Reminders her of decaying teeth- patient does not think it is BV  Protocols used: Vaginal Symptoms-A-AH

## 2023-01-22 NOTE — Telephone Encounter (Signed)
Pt denied appt  ?

## 2023-01-29 ENCOUNTER — Ambulatory Visit: Payer: Self-pay | Admitting: *Deleted

## 2023-01-29 NOTE — Telephone Encounter (Signed)
Summary: concerns about Rx   Pt stated that she has not received the conjugated estrogens (PREMARIN) vaginal cream. Pt stated there is a problem with the Rx and request call back to discuss. Cb# [phone]       Chief Complaint: premarin Rx needs to be updated with specific grams to use for application from [facility]. Symptoms: worsening vagina dryness Frequency: na Pertinent Negatives: Patient denies na Disposition: [] ED /[] Urgent Care (no appt availability in office) / [] Appointment(In office/virtual)/ []  [facility]/ [] Home Care/ [] Refused Recommended Disposition /[] [facility]/ [x]  Follow-up with PCP Additional Notes:   Patient reports Rx can not be refilled until PCP writes specific dose to administer. Reports a copy of previous dose was sent to office. Please contact [facility] for clarification. Patient reports she has been taking 0.5- 1 gram   and after initial sx decrease take 2 times a week. Please advise patient reports she really needs medication     Answer Assessment - Initial Assessment Questions 1. NAME of MEDICINE: "What medicine(s) are you calling about?"     Premarin  2. QUESTION: "What is your question?" (e.g., double dose of medicine, side effect)     [facility]?  3. PRESCRIBER: "Who prescribed the medicine?" Reason: if prescribed by specialist, call should be referred to that group.     Dr. Carlynn Purl 4. SYMPTOMS: "Do you have any symptoms?" If Yes, ask: "What symptoms are you having?"  "How bad are the symptoms (e.g., mild, moderate, severe)     Yes sx are worsening without cream 5. PREGNANCY:  "Is there any chance that you are pregnant?" "When was your last menstrual period?"     na  Protocols used: Medication Question Call-A-AH

## 2023-01-29 NOTE — Telephone Encounter (Signed)
Spoke to patient and she stated this has been taken care of.

## 2023-02-03 ENCOUNTER — Other Ambulatory Visit: Payer: Self-pay | Admitting: Neurology

## 2023-02-03 DIAGNOSIS — M5412 Radiculopathy, cervical region: Secondary | ICD-10-CM

## 2023-02-04 ENCOUNTER — Other Ambulatory Visit: Payer: Self-pay

## 2023-02-04 ENCOUNTER — Emergency Department
Admission: EM | Admit: 2023-02-04 | Discharge: 2023-02-04 | Disposition: A | Discharge status: Home / Self Care | Attending: Emergency Medicine | Admitting: Emergency Medicine

## 2023-02-04 DIAGNOSIS — Z21 Asymptomatic human immunodeficiency virus [HIV] infection status: Secondary | ICD-10-CM | POA: Insufficient documentation

## 2023-02-04 DIAGNOSIS — I10 Essential (primary) hypertension: Secondary | ICD-10-CM | POA: Diagnosis not present

## 2023-02-04 DIAGNOSIS — M79602 Pain in left arm: Secondary | ICD-10-CM | POA: Insufficient documentation

## 2023-02-04 DIAGNOSIS — M5412 Radiculopathy, cervical region: Secondary | ICD-10-CM | POA: Insufficient documentation

## 2023-02-04 MED ORDER — PREDNISONE 20 MG PO TABS: 40.0000 mg | ORAL_TABLET | Freq: Every day | ORAL | 0 refills | Status: AC

## 2023-02-04 MED ORDER — DEXAMETHASONE SODIUM PHOSPHATE 10 MG/ML IJ SOLN
10.0000 mg | Freq: Once | INTRAMUSCULAR | Status: AC
  Administered 2023-02-04: 10 mg via INTRAMUSCULAR
  Filled 2023-02-04: qty 1

## 2023-02-04 MED ORDER — HYDROCODONE-ACETAMINOPHEN 5-325 MG PO TABS: 1.0000 | ORAL_TABLET | Freq: Three times a day (TID) | ORAL | 0 refills | Status: AC | PRN

## 2023-02-04 MED ORDER — FENTANYL CITRATE PF 50 MCG/ML IJ SOSY
50.0000 ug | PREFILLED_SYRINGE | Freq: Once | INTRAMUSCULAR | Status: AC
  Administered 2023-02-04: 50 ug via INTRAVENOUS
  Filled 2023-02-04: qty 1

## 2023-02-04 NOTE — ED Triage Notes (Signed)
Pt comes with c/o left arm pain. Pt states she does have flare ups in that arm. Pt states it is painful. Pt not sure if pinched nerve. Pt states she is waiting for MRI to see what is going on by her neurologist. Pt states she has been dealing with this for years.

## 2023-02-04 NOTE — Discharge Instructions (Addendum)
Take the prescription meds as to directed. You should follow-up with neurology as scheduled.

## 2023-02-04 NOTE — ED Notes (Signed)
See triage notes. Patient states she has cervical spine issues and has had what's going on today happen before. Patient stated she was supposed to have surgery on her neck but put it off to have a total knee replacement. Endorses severe pain in the left arm.

## 2023-02-04 NOTE — ED Provider Notes (Signed)
[facility] Emergency Department Provider Note     Event Date/Time   First MD Initiated Contact with Patient 02/04/23 1805     (approximate)   History   Arm Pain   HPI  Hailey Reyes is a 62 y.o. female with a history of HTN, HIV, chronic right knee pain, presents to the ED with left upper extremity paresthesias and pain.  Patient with a history of the same, was recently transferred to the area from [facility].  She had an initial evaluation with Dr. Malvin Johns, who is planning an outpatient MRI.  Patient denies any recent injury, trauma, or fall but she denies any chest pain, shortness of breath, or weakness.  She also denies any bladder or bowel incontinence.  She presents to the ED with worsening pain and disability likely due to a radicular nerve impingement.  She is requesting evaluation and pain management at this time.   Physical Exam   Triage Vital Signs: ED Triage Vitals  Enc Vitals Group     BP 02/04/23 1754 120/80     Pulse Rate 02/04/23 1754 (!) 103     Resp 02/04/23 1754 18     Temp 02/04/23 1754 98.5 F (36.9 C)     Temp Source 02/04/23 1754 Oral     SpO2 02/04/23 1754 98 %     Weight --      Height --      Head Circumference --      Peak Flow --      Pain Score 02/04/23 1757 10     Pain Loc --      Pain Edu? --      Excl. in GC? --     Most recent vital signs: Vitals:   02/04/23 1754 02/04/23 2014  BP: 120/80 107/78  Pulse: (!) 103 77  Resp: 18 15  Temp: 98.5 F (36.9 C) 98.7 F (37.1 C)  SpO2: 98% 100%    General Awake, no distress. NAD CV:  Good peripheral perfusion.  RRR RESP:  Normal effort.  CTA MSK:  Normal spinal alignment without midline tenderness, spasm, deformity, or step-off.  Patient with tenderness palpation over the left trapezius musculature.  She is able to demonstrate composite fist on the left although limited by her subjective complaints of pain and disability. NEURO: Cranial nerves II  to XII grossly intact.   ED Results / Procedures / Treatments   Labs (all labs ordered are listed, but only abnormal results are displayed) Labs Reviewed - No data to display   EKG   RADIOLOGY  No results found.   PROCEDURES:  Critical Care performed: No  Procedures   MEDICATIONS ORDERED IN ED: Medications  dexamethasone (DECADRON) injection 10 mg (10 mg Intramuscular Given 02/04/23 1834)  fentaNYL (SUBLIMAZE) injection 50 mcg (50 mcg Intravenous Given 02/04/23 1911)     IMPRESSION / MDM / ASSESSMENT AND PLAN / ED COURSE  I reviewed the triage vital signs and the nursing notes.                              Differential diagnosis includes, but is not limited to, cervical radiculopathy, carpal tunnel syndrome, myalgias  Patient's presentation is most consistent with acute, uncomplicated illness.  Patient's diagnosis is consistent with cervical radiculopathy. Patient will be discharged home with prescriptions for prednisone and hydrocodone (#10). Patient is to follow up with Dr. Malvin Johns as needed or otherwise directed.  Patient is given ED precautions to return to the ED for any worsening or new symptoms.     FINAL CLINICAL IMPRESSION(S) / ED DIAGNOSES   Final diagnoses:  Left arm pain  Cervical radiculopathy     Rx / DC Orders   ED Discharge Orders          Ordered    predniSONE (DELTASONE) 20 MG tablet  Daily with breakfast        02/04/23 1945    HYDROcodone-acetaminophen (NORCO) 5-325 MG tablet  3 times daily PRN        02/04/23 1945             Note:  This document was prepared using Dragon voice recognition software and may include unintentional dictation errors.    Lissa Hoard, PA-C 02/04/23 2250    Sharyn Creamer, MD 02/05/23 [phone]

## 2023-02-20 ENCOUNTER — Ambulatory Visit
Admission: RE | Admit: 2023-02-20 | Discharge: 2023-02-20 | Disposition: A | Discharge status: Home / Self Care | Source: Ambulatory Visit | Attending: Neurology | Admitting: Neurology

## 2023-02-20 ENCOUNTER — Other Ambulatory Visit

## 2023-02-20 DIAGNOSIS — M5412 Radiculopathy, cervical region: Secondary | ICD-10-CM

## 2023-04-09 ENCOUNTER — Telehealth: Payer: Self-pay | Admitting: Infectious Diseases

## 2023-04-09 NOTE — Telephone Encounter (Signed)
Hailey Reyes called to verify her 9/10 appointment had been cancelled. She stated she would be in [place] for a while, would continue taking her medicine, and would call to schedule when she returns. In case Ravishankar did not hear from her in a while, she wanted to make sure she knew the reason why.

## 2023-04-14 ENCOUNTER — Ambulatory Visit: Admitting: Infectious Diseases

## 2023-04-15 ENCOUNTER — Telehealth: Payer: Self-pay | Admitting: Emergency Medicine

## 2023-04-16 NOTE — Telephone Encounter (Signed)
Copied from CRM [id number]. Topic: Appointment Scheduling - Scheduling Inquiry for Clinic >> Apr 15, 2023  1:49 PM Marlow Baars wrote: Reason for CRM: The patient called to cancel her appt and to let her provider know she moved back to [place] >> Apr 15, 2023  2:50 PM RMA Leanna Sato wrote: Front staff notified

## 2023-05-14 ENCOUNTER — Ambulatory Visit: Admitting: Nurse Practitioner

## 2023-06-02 ENCOUNTER — Encounter: Primary: Family Medicine

## 2023-06-17 ENCOUNTER — Telehealth: Payer: Self-pay | Admitting: Nurse Practitioner

## 2023-06-17 NOTE — Telephone Encounter (Signed)
Copied from CRM [id number]. Topic: General - Other >> Jun 17, 2023  9:59 AM Franchot Heidelberg wrote: Reason for CRM: Pt now sees Dr. Simonne Come at [facility] in [facility] [address] [place] [address]  Fax: [phone]   She is asking for her recent mammogram images from June to get faxed to the the number above.

## 2023-06-17 NOTE — Telephone Encounter (Signed)
Pt will go and sign a release for the requested med records

## 2023-06-17 NOTE — Telephone Encounter (Signed)
Patient need to sign medical record release and it can be sent to that office if they do not have care everywhere Epic

## 2023-10-20 IMAGING — CR DG CHEST 2V
1 series · 2 of 2 positions shown · non-contrast
Comparison: None.

CLINICAL DATA: Chest PA and shortness of breath

EXAM:
CHEST - 2 VIEW

[Series 1: dg chest 2 view · 0.14mm/px · 2 of 2 slices shown]
[im 1/2]
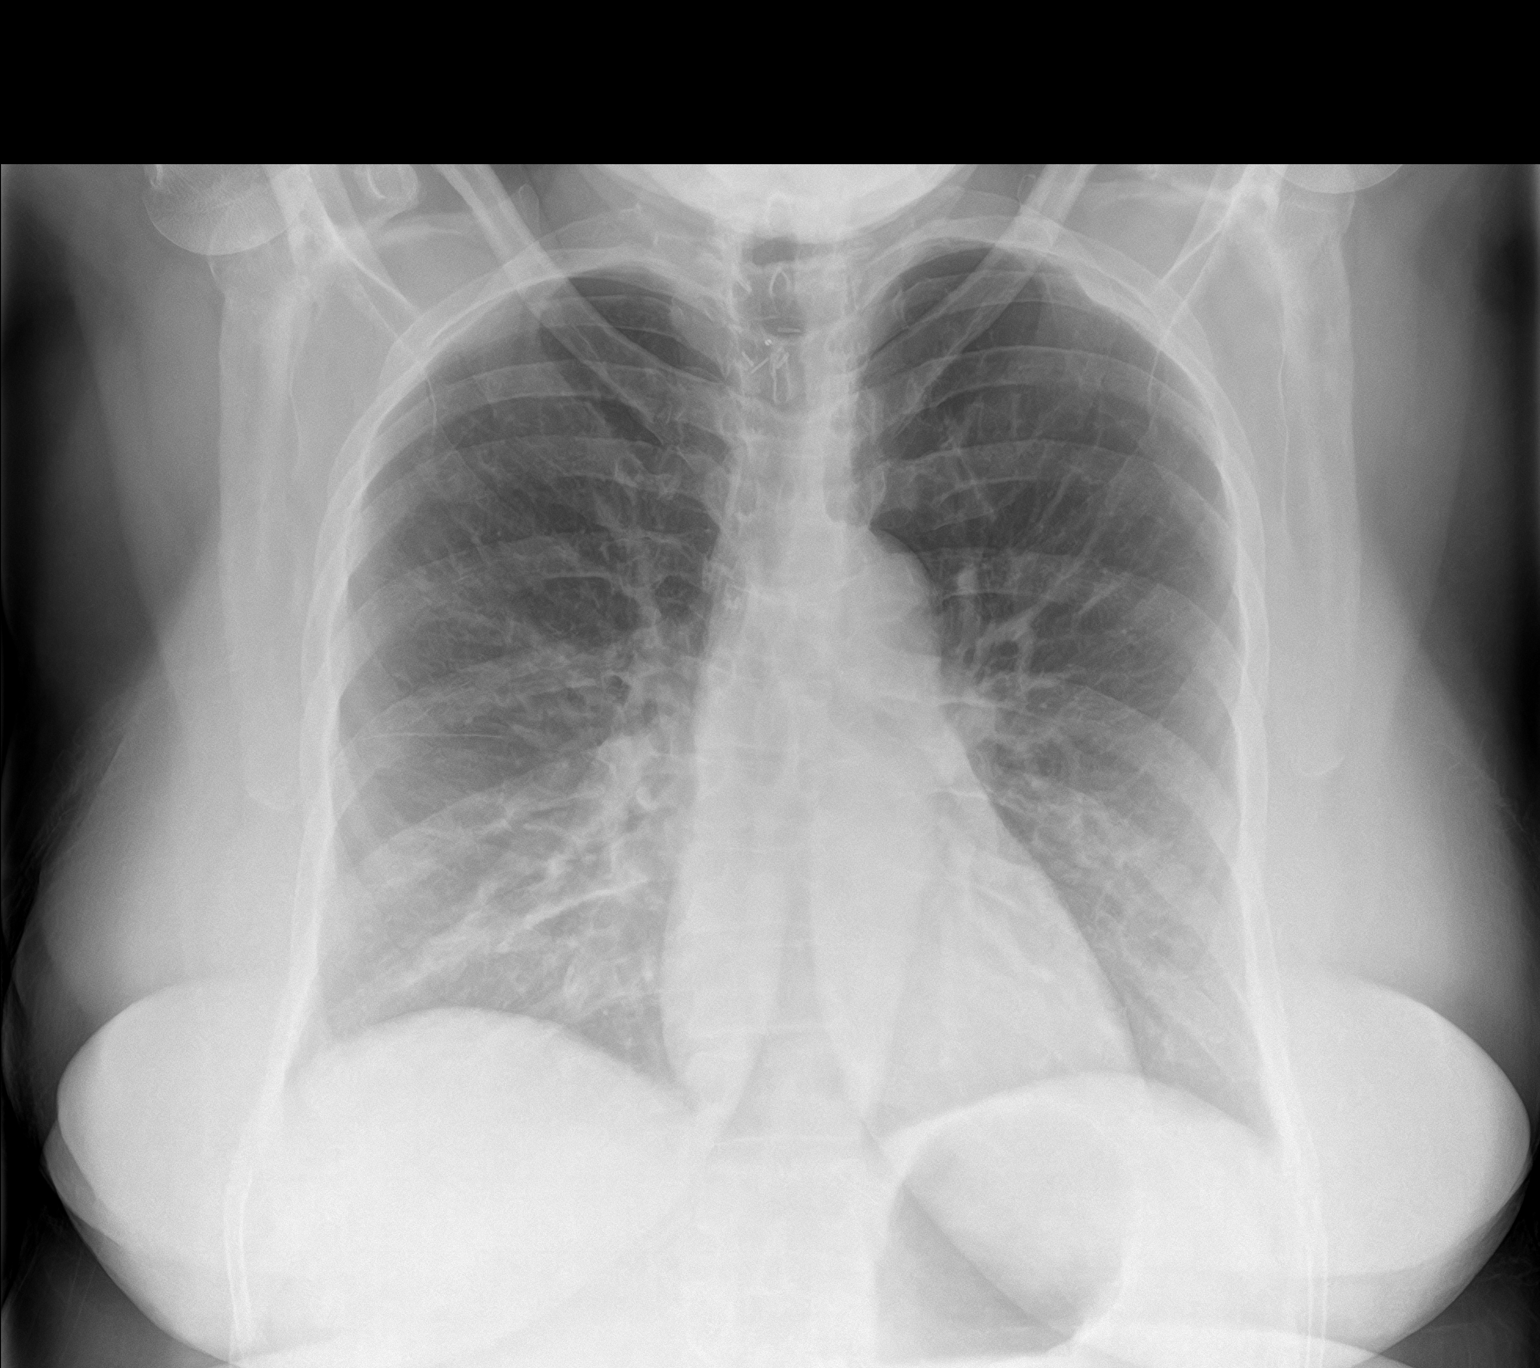
[im 2/2]
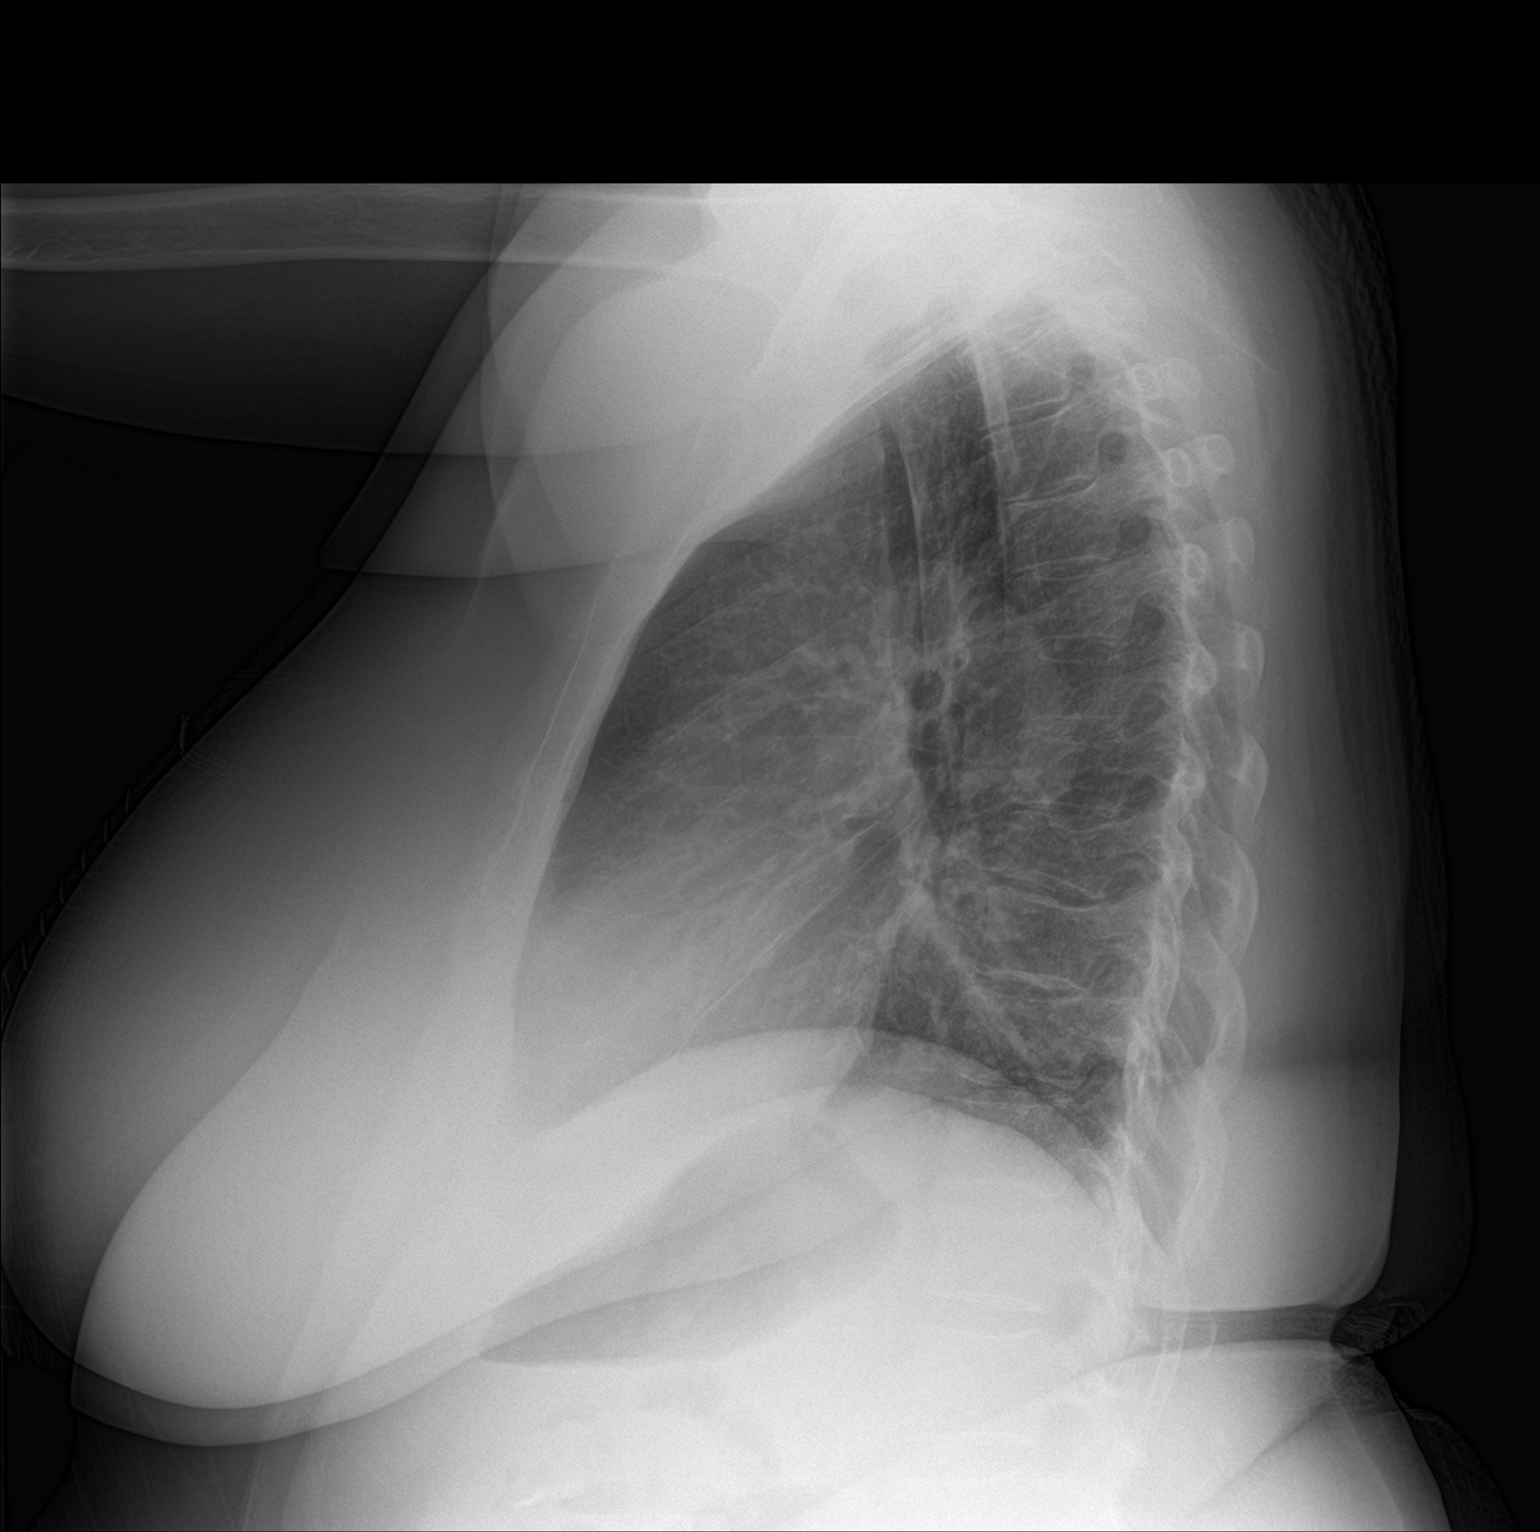

[2 of 2 positions shown; findings below may reference images not displayed]

FINDINGS: The heart size and mediastinal contours are within normal limits.
Both lungs are clear. The visualized skeletal structures are
unremarkable.
IMPRESSION: No active cardiopulmonary disease.
